# Patient Record
Sex: Female | Born: 1935 | Race: White | Hispanic: No | Smoking: Never smoker
Health system: Southern US, Community
[De-identification: ages and names within clinical notes are randomized; demographics above are authoritative.]

## PROBLEM LIST (undated history)

## (undated) DIAGNOSIS — I509 Heart failure, unspecified: Secondary | ICD-10-CM

## (undated) DIAGNOSIS — K219 Gastro-esophageal reflux disease without esophagitis: Secondary | ICD-10-CM

## (undated) DIAGNOSIS — F419 Anxiety disorder, unspecified: Secondary | ICD-10-CM

## (undated) DIAGNOSIS — I10 Essential (primary) hypertension: Secondary | ICD-10-CM

## (undated) DIAGNOSIS — H9190 Unspecified hearing loss, unspecified ear: Secondary | ICD-10-CM

## (undated) DIAGNOSIS — M1990 Unspecified osteoarthritis, unspecified site: Secondary | ICD-10-CM

## (undated) DIAGNOSIS — G459 Transient cerebral ischemic attack, unspecified: Secondary | ICD-10-CM

## (undated) DIAGNOSIS — J449 Chronic obstructive pulmonary disease, unspecified: Secondary | ICD-10-CM

## (undated) DIAGNOSIS — J8410 Pulmonary fibrosis, unspecified: Secondary | ICD-10-CM

## (undated) HISTORY — PX: CHOLECYSTECTOMY: SHX55

## (undated) HISTORY — PX: ABDOMINAL HYSTERECTOMY: SHX81

## (undated) HISTORY — PX: HERNIA REPAIR: SHX51

---

## 2004-05-28 ENCOUNTER — Ambulatory Visit: Payer: Self-pay | Admitting: Nurse Practitioner

## 2004-06-16 ENCOUNTER — Ambulatory Visit: Payer: Self-pay

## 2004-09-02 ENCOUNTER — Ambulatory Visit: Payer: Self-pay | Admitting: Podiatry

## 2005-05-19 ENCOUNTER — Ambulatory Visit: Payer: Self-pay | Admitting: Nurse Practitioner

## 2005-06-29 ENCOUNTER — Ambulatory Visit: Payer: Self-pay | Admitting: Gastroenterology

## 2006-05-25 ENCOUNTER — Ambulatory Visit: Payer: Self-pay | Admitting: Nurse Practitioner

## 2006-11-28 ENCOUNTER — Ambulatory Visit: Payer: Self-pay | Admitting: Podiatry

## 2006-12-02 ENCOUNTER — Ambulatory Visit: Payer: Self-pay | Admitting: Podiatry

## 2007-05-28 ENCOUNTER — Inpatient Hospital Stay: Payer: Self-pay | Admitting: Internal Medicine

## 2007-05-28 ENCOUNTER — Other Ambulatory Visit: Payer: Self-pay

## 2007-06-06 ENCOUNTER — Other Ambulatory Visit: Payer: Self-pay

## 2007-06-06 ENCOUNTER — Emergency Department: Payer: Self-pay | Admitting: Emergency Medicine

## 2007-07-22 ENCOUNTER — Emergency Department: Payer: Self-pay | Admitting: Internal Medicine

## 2007-08-02 ENCOUNTER — Ambulatory Visit: Payer: Self-pay | Admitting: Family Medicine

## 2007-11-13 ENCOUNTER — Other Ambulatory Visit: Payer: Self-pay

## 2007-11-13 ENCOUNTER — Emergency Department: Payer: Self-pay | Admitting: Emergency Medicine

## 2007-12-12 ENCOUNTER — Ambulatory Visit: Payer: Self-pay | Admitting: *Deleted

## 2008-04-08 ENCOUNTER — Emergency Department: Payer: Self-pay | Admitting: Internal Medicine

## 2008-05-02 ENCOUNTER — Inpatient Hospital Stay: Payer: Self-pay | Admitting: Internal Medicine

## 2008-11-25 ENCOUNTER — Emergency Department: Payer: Self-pay | Admitting: Unknown Physician Specialty

## 2008-12-16 ENCOUNTER — Ambulatory Visit: Payer: Self-pay | Admitting: Family Medicine

## 2009-04-22 ENCOUNTER — Emergency Department: Payer: Self-pay | Admitting: Emergency Medicine

## 2009-04-30 ENCOUNTER — Inpatient Hospital Stay: Payer: Self-pay | Admitting: Internal Medicine

## 2009-08-21 ENCOUNTER — Ambulatory Visit: Payer: Self-pay | Admitting: Specialist

## 2010-01-26 ENCOUNTER — Ambulatory Visit: Payer: Self-pay | Admitting: Family Medicine

## 2010-01-29 ENCOUNTER — Emergency Department: Payer: Self-pay | Admitting: Emergency Medicine

## 2010-03-21 ENCOUNTER — Inpatient Hospital Stay: Payer: Self-pay | Admitting: Internal Medicine

## 2010-07-31 ENCOUNTER — Emergency Department: Payer: Self-pay | Admitting: Emergency Medicine

## 2010-08-24 ENCOUNTER — Ambulatory Visit: Payer: Self-pay | Admitting: Pain Medicine

## 2010-08-24 ENCOUNTER — Ambulatory Visit: Payer: Self-pay | Admitting: Specialist

## 2010-09-02 ENCOUNTER — Ambulatory Visit: Payer: Self-pay | Admitting: Pain Medicine

## 2010-09-29 ENCOUNTER — Ambulatory Visit: Payer: Self-pay | Admitting: Pain Medicine

## 2010-10-19 ENCOUNTER — Ambulatory Visit: Payer: Self-pay | Admitting: Pain Medicine

## 2010-11-04 ENCOUNTER — Ambulatory Visit: Payer: Self-pay | Admitting: Pain Medicine

## 2010-11-13 ENCOUNTER — Inpatient Hospital Stay: Payer: Self-pay | Admitting: Internal Medicine

## 2010-11-26 ENCOUNTER — Inpatient Hospital Stay: Payer: Self-pay | Admitting: Internal Medicine

## 2010-12-22 ENCOUNTER — Inpatient Hospital Stay: Payer: Self-pay | Admitting: Specialist

## 2011-03-03 ENCOUNTER — Other Ambulatory Visit: Payer: Self-pay | Admitting: Unknown Physician Specialty

## 2011-03-13 ENCOUNTER — Emergency Department: Payer: Self-pay | Admitting: *Deleted

## 2011-03-13 ENCOUNTER — Other Ambulatory Visit: Payer: Self-pay

## 2011-04-28 ENCOUNTER — Ambulatory Visit: Payer: Self-pay | Admitting: Family Medicine

## 2012-01-09 ENCOUNTER — Observation Stay: Payer: Self-pay | Admitting: Internal Medicine

## 2012-01-09 LAB — COMPREHENSIVE METABOLIC PANEL
Anion Gap: 8 (ref 7–16)
BUN: 14 mg/dL (ref 7–18)
Bilirubin,Total: 0.3 mg/dL (ref 0.2–1.0)
Calcium, Total: 8.6 mg/dL (ref 8.5–10.1)
Chloride: 109 mmol/L — ABNORMAL HIGH (ref 98–107)
Co2: 25 mmol/L (ref 21–32)
EGFR (African American): >60
EGFR (Non-African Amer.): >60
Glucose: 115 mg/dL — ABNORMAL HIGH (ref 65–99)
Osmolality: 285 (ref 275–301)
Potassium: 4.7 mmol/L (ref 3.5–5.1)
SGOT(AST): 36 U/L (ref 15–37)
Sodium: 142 mmol/L (ref 136–145)
Total Protein: 7.9 g/dL (ref 6.4–8.2)

## 2012-01-09 LAB — TROPONIN I: Troponin-I: 0.06 ng/mL — ABNORMAL HIGH

## 2012-01-09 LAB — URINALYSIS, COMPLETE
Bacteria: NONE SEEN
Bilirubin,UR: NEGATIVE
Ketone: NEGATIVE
Leukocyte Esterase: NEGATIVE
Ph: 5 (ref 4.5–8.0)
Squamous Epithelial: 1

## 2012-01-09 LAB — CBC WITH DIFFERENTIAL/PLATELET
Basophil #: 0 10*3/uL (ref 0.0–0.1)
Basophil %: 1.1 %
Eosinophil #: 0.1 10*3/uL (ref 0.0–0.7)
HCT: 36.3 % (ref 35.0–47.0)
Lymphocyte %: 16.9 %
MCHC: 33.4 g/dL (ref 32.0–36.0)
MCV: 93 fL (ref 80–100)
Monocyte #: 0.5 x10 3/mm (ref 0.2–0.9)
Neutrophil #: 2.8 10*3/uL (ref 1.4–6.5)
Platelet: 150 10*3/uL (ref 150–440)
RDW: 16.2 % — ABNORMAL HIGH (ref 11.5–14.5)
WBC: 4.1 10*3/uL (ref 3.6–11.0)

## 2012-01-09 LAB — LIPASE, BLOOD: Lipase: 192 U/L (ref 73–393)

## 2012-01-10 LAB — CBC WITH DIFFERENTIAL/PLATELET
Basophil #: 0 10*3/uL (ref 0.0–0.1)
Basophil %: 1.1 %
Eosinophil #: 0.1 10*3/uL (ref 0.0–0.7)
HCT: 36 % (ref 35.0–47.0)
HGB: 12.4 g/dL (ref 12.0–16.0)
Lymphocyte #: 1.1 10*3/uL (ref 1.0–3.6)
MCHC: 34.3 g/dL (ref 32.0–36.0)
MCV: 93 fL (ref 80–100)
Monocyte %: 9 %
Neutrophil #: 2.1 10*3/uL (ref 1.4–6.5)
RDW: 15.6 % — ABNORMAL HIGH (ref 11.5–14.5)
WBC: 3.7 10*3/uL (ref 3.6–11.0)

## 2012-01-10 LAB — LIPID PANEL
HDL Cholesterol: 48 mg/dL (ref 40–60)
Ldl Cholesterol, Calc: 43 mg/dL (ref 0–100)
VLDL Cholesterol, Calc: 33 mg/dL (ref 5–40)

## 2012-01-10 LAB — BASIC METABOLIC PANEL
Calcium, Total: 8.7 mg/dL (ref 8.5–10.1)
Co2: 28 mmol/L (ref 21–32)
Creatinine: 0.84 mg/dL (ref 0.60–1.30)
EGFR (African American): >60
EGFR (Non-African Amer.): >60
Potassium: 3.7 mmol/L (ref 3.5–5.1)
Sodium: 143 mmol/L (ref 136–145)

## 2012-01-10 LAB — HEMOGLOBIN A1C: Hemoglobin A1C: 5.7 % (ref 4.2–6.3)

## 2012-01-10 LAB — CK-MB: CK-MB: 1.5 ng/mL (ref 0.5–3.6)

## 2012-01-10 LAB — CK: CK, Total: 59 U/L (ref 21–215)

## 2012-01-16 ENCOUNTER — Emergency Department: Payer: Self-pay | Admitting: Emergency Medicine

## 2012-01-16 LAB — COMPREHENSIVE METABOLIC PANEL
Albumin: 3.5 g/dL (ref 3.4–5.0)
Alkaline Phosphatase: 71 U/L (ref 50–136)
Anion Gap: 9 (ref 7–16)
Bilirubin,Total: 0.4 mg/dL (ref 0.2–1.0)
Co2: 26 mmol/L (ref 21–32)
Creatinine: 0.81 mg/dL (ref 0.60–1.30)
EGFR (Non-African Amer.): >60
Glucose: 112 mg/dL — ABNORMAL HIGH (ref 65–99)
Osmolality: 279 (ref 275–301)
Potassium: 3.9 mmol/L (ref 3.5–5.1)
SGPT (ALT): 26 U/L (ref 12–78)
Sodium: 139 mmol/L (ref 136–145)
Total Protein: 8.5 g/dL — ABNORMAL HIGH (ref 6.4–8.2)

## 2012-01-16 LAB — LIPASE, BLOOD: Lipase: 201 U/L (ref 73–393)

## 2012-01-16 LAB — CBC
HGB: 12.9 g/dL (ref 12.0–16.0)
MCH: 31.3 pg (ref 26.0–34.0)
Platelet: 168 10*3/uL (ref 150–440)
RBC: 4.12 10*6/uL (ref 3.80–5.20)
WBC: 5.7 10*3/uL (ref 3.6–11.0)

## 2012-01-16 LAB — URINALYSIS, COMPLETE
Bilirubin,UR: NEGATIVE
Glucose,UR: NEGATIVE mg/dL (ref 0–75)
Ketone: NEGATIVE
Leukocyte Esterase: NEGATIVE
Protein: NEGATIVE
RBC,UR: 1 /HPF (ref 0–5)
Squamous Epithelial: 1
WBC UR: <1 /HPF (ref 0–5)

## 2012-01-21 ENCOUNTER — Emergency Department: Payer: Self-pay | Admitting: Emergency Medicine

## 2012-01-21 LAB — URINALYSIS, COMPLETE
Bilirubin,UR: NEGATIVE
Leukocyte Esterase: NEGATIVE
Ph: 7 (ref 4.5–8.0)
Protein: NEGATIVE
RBC,UR: NONE SEEN /HPF (ref 0–5)

## 2012-02-16 ENCOUNTER — Ambulatory Visit: Payer: Self-pay | Admitting: Family Medicine

## 2012-09-23 ENCOUNTER — Emergency Department: Payer: Self-pay | Admitting: Emergency Medicine

## 2012-09-23 LAB — CBC
HGB: 10.8 g/dL — ABNORMAL LOW (ref 12.0–16.0)
MCHC: 33.8 g/dL (ref 32.0–36.0)
MCV: 87 fL (ref 80–100)
RBC: 3.68 10*6/uL — ABNORMAL LOW (ref 3.80–5.20)
WBC: 4.8 10*3/uL (ref 3.6–11.0)

## 2012-09-23 LAB — BASIC METABOLIC PANEL
Anion Gap: 8 (ref 7–16)
BUN: 14 mg/dL (ref 7–18)
Calcium, Total: 9.2 mg/dL (ref 8.5–10.1)
Chloride: 104 mmol/L (ref 98–107)
Creatinine: 1.34 mg/dL — ABNORMAL HIGH (ref 0.60–1.30)
EGFR (Non-African Amer.): 37 — ABNORMAL LOW
Osmolality: 278 (ref 275–301)

## 2012-09-23 LAB — HEPATIC FUNCTION PANEL A (ARMC)
Albumin: 3.2 g/dL — ABNORMAL LOW (ref 3.4–5.0)
Alkaline Phosphatase: 65 U/L (ref 50–136)
Bilirubin,Total: 0.3 mg/dL (ref 0.2–1.0)
SGOT(AST): 22 U/L (ref 15–37)
SGPT (ALT): 20 U/L (ref 12–78)
Total Protein: 8 g/dL (ref 6.4–8.2)

## 2012-09-23 LAB — PRO B NATRIURETIC PEPTIDE: B-Type Natriuretic Peptide: 597 pg/mL — ABNORMAL HIGH (ref 0–450)

## 2012-09-23 LAB — TROPONIN I: Troponin-I: <0.02 ng/mL

## 2012-09-24 LAB — TROPONIN I: Troponin-I: <0.02 ng/mL

## 2012-09-24 LAB — CK TOTAL AND CKMB (NOT AT ARMC): CK, Total: 63 U/L (ref 21–215)

## 2013-03-31 ENCOUNTER — Inpatient Hospital Stay: Payer: Self-pay | Admitting: Student

## 2013-03-31 LAB — CBC
HCT: 33.4 % — ABNORMAL LOW (ref 35.0–47.0)
HGB: 11.5 g/dL — AB (ref 12.0–16.0)
MCH: 31.7 pg (ref 26.0–34.0)
MCHC: 34.3 g/dL (ref 32.0–36.0)
MCV: 92 fL (ref 80–100)
Platelet: 173 10*3/uL (ref 150–440)
RBC: 3.62 10*6/uL — AB (ref 3.80–5.20)
RDW: 14.8 % — ABNORMAL HIGH (ref 11.5–14.5)
WBC: 14.3 10*3/uL — AB (ref 3.6–11.0)

## 2013-03-31 LAB — COMPREHENSIVE METABOLIC PANEL
ALBUMIN: 3.4 g/dL (ref 3.4–5.0)
Alkaline Phosphatase: 62 U/L
Anion Gap: 4 — ABNORMAL LOW (ref 7–16)
BUN: 30 mg/dL — ABNORMAL HIGH (ref 7–18)
Bilirubin,Total: 0.7 mg/dL (ref 0.2–1.0)
CREATININE: 1.68 mg/dL — AB (ref 0.60–1.30)
Calcium, Total: 8.6 mg/dL (ref 8.5–10.1)
Chloride: 98 mmol/L (ref 98–107)
Co2: 26 mmol/L (ref 21–32)
EGFR (African American): 32 — ABNORMAL LOW
EGFR (Non-African Amer.): 28 — ABNORMAL LOW
Glucose: 115 mg/dL — ABNORMAL HIGH (ref 65–99)
OSMOLALITY: 264 (ref 275–301)
Potassium: 3.8 mmol/L (ref 3.5–5.1)
SGOT(AST): 38 U/L — ABNORMAL HIGH (ref 15–37)
SGPT (ALT): 26 U/L (ref 12–78)
SODIUM: 128 mmol/L — AB (ref 136–145)
TOTAL PROTEIN: 8.6 g/dL — AB (ref 6.4–8.2)

## 2013-03-31 LAB — URINALYSIS, COMPLETE
Bilirubin,UR: NEGATIVE
Blood: NEGATIVE
Glucose,UR: NEGATIVE mg/dL (ref 0–75)
Granular Cast: 1
KETONE: NEGATIVE
Nitrite: NEGATIVE
PH: 5 (ref 4.5–8.0)
RBC,UR: 2 /HPF (ref 0–5)
SPECIFIC GRAVITY: 1.017 (ref 1.003–1.030)
Transitional Epi: 2

## 2013-03-31 LAB — CK TOTAL AND CKMB (NOT AT ARMC)
CK, Total: 292 U/L — ABNORMAL HIGH (ref 21–215)
CK-MB: 3.1 ng/mL (ref 0.5–3.6)

## 2013-03-31 LAB — TROPONIN I: Troponin-I: <0.02 ng/mL

## 2013-04-01 LAB — BASIC METABOLIC PANEL
Anion Gap: 6 — ABNORMAL LOW (ref 7–16)
BUN: 22 mg/dL — ABNORMAL HIGH (ref 7–18)
Calcium, Total: 8.2 mg/dL — ABNORMAL LOW (ref 8.5–10.1)
Chloride: 104 mmol/L (ref 98–107)
Co2: 26 mmol/L (ref 21–32)
Creatinine: 1.15 mg/dL (ref 0.60–1.30)
EGFR (African American): 51 — ABNORMAL LOW
GFR CALC NON AF AMER: 44 — AB
Glucose: 96 mg/dL (ref 65–99)
Osmolality: 275 (ref 275–301)
POTASSIUM: 3.7 mmol/L (ref 3.5–5.1)
SODIUM: 136 mmol/L (ref 136–145)

## 2013-04-01 LAB — CBC WITH DIFFERENTIAL/PLATELET
Basophil #: 0 10*3/uL (ref 0.0–0.1)
Basophil %: 0.2 %
EOS ABS: 0.1 10*3/uL (ref 0.0–0.7)
Eosinophil %: 0.6 %
HCT: 30 % — AB (ref 35.0–47.0)
HGB: 10 g/dL — ABNORMAL LOW (ref 12.0–16.0)
LYMPHS PCT: 14.6 %
Lymphocyte #: 1.7 10*3/uL (ref 1.0–3.6)
MCH: 30.9 pg (ref 26.0–34.0)
MCHC: 33.2 g/dL (ref 32.0–36.0)
MCV: 93 fL (ref 80–100)
MONO ABS: 1.1 x10 3/mm — AB (ref 0.2–0.9)
Monocyte %: 9.1 %
NEUTROS ABS: 8.8 10*3/uL — AB (ref 1.4–6.5)
Neutrophil %: 75.5 %
Platelet: 154 10*3/uL (ref 150–440)
RBC: 3.22 10*6/uL — AB (ref 3.80–5.20)
RDW: 14.6 % — ABNORMAL HIGH (ref 11.5–14.5)
WBC: 11.6 10*3/uL — ABNORMAL HIGH (ref 3.6–11.0)

## 2013-04-01 LAB — MAGNESIUM: Magnesium: 1.7 mg/dL — ABNORMAL LOW

## 2013-04-01 LAB — TSH: Thyroid Stimulating Horm: 0.656 u[IU]/mL

## 2013-04-02 LAB — CBC WITH DIFFERENTIAL/PLATELET
Basophil #: 0 10*3/uL (ref 0.0–0.1)
Basophil %: 0.1 %
Eosinophil #: 0 10*3/uL (ref 0.0–0.7)
Eosinophil %: 0 %
HCT: 27.2 % — ABNORMAL LOW (ref 35.0–47.0)
HGB: 9.4 g/dL — ABNORMAL LOW (ref 12.0–16.0)
Lymphocyte #: 0.5 10*3/uL — ABNORMAL LOW (ref 1.0–3.6)
Lymphocyte %: 7.8 %
MCH: 32.6 pg (ref 26.0–34.0)
MCHC: 34.5 g/dL (ref 32.0–36.0)
MCV: 94 fL (ref 80–100)
MONO ABS: 0.1 x10 3/mm — AB (ref 0.2–0.9)
MONOS PCT: 2 %
Neutrophil #: 5.5 10*3/uL (ref 1.4–6.5)
Neutrophil %: 90.1 %
Platelet: 146 10*3/uL — ABNORMAL LOW (ref 150–440)
RBC: 2.88 10*6/uL — AB (ref 3.80–5.20)
RDW: 14.7 % — AB (ref 11.5–14.5)
WBC: 6.1 10*3/uL (ref 3.6–11.0)

## 2013-04-03 LAB — CBC WITH DIFFERENTIAL/PLATELET
Basophil #: 0 10*3/uL (ref 0.0–0.1)
Basophil %: 0.3 %
Eosinophil #: 0 10*3/uL (ref 0.0–0.7)
Eosinophil %: 0 %
HCT: 30.9 % — AB (ref 35.0–47.0)
HGB: 10.5 g/dL — ABNORMAL LOW (ref 12.0–16.0)
LYMPHS ABS: 0.7 10*3/uL — AB (ref 1.0–3.6)
Lymphocyte %: 5.5 %
MCH: 32.6 pg (ref 26.0–34.0)
MCHC: 33.9 g/dL (ref 32.0–36.0)
MCV: 96 fL (ref 80–100)
MONOS PCT: 2.4 %
Monocyte #: 0.3 x10 3/mm (ref 0.2–0.9)
NEUTROS ABS: 11.1 10*3/uL — AB (ref 1.4–6.5)
Neutrophil %: 91.8 %
Platelet: 181 10*3/uL (ref 150–440)
RBC: 3.21 10*6/uL — ABNORMAL LOW (ref 3.80–5.20)
RDW: 14.8 % — ABNORMAL HIGH (ref 11.5–14.5)
WBC: 12.1 10*3/uL — ABNORMAL HIGH (ref 3.6–11.0)

## 2013-04-05 LAB — CULTURE, BLOOD (SINGLE)

## 2013-09-30 ENCOUNTER — Emergency Department: Payer: Self-pay | Admitting: Emergency Medicine

## 2013-09-30 LAB — BASIC METABOLIC PANEL
Anion Gap: 5 — ABNORMAL LOW (ref 7–16)
BUN: 21 mg/dL — ABNORMAL HIGH (ref 7–18)
Calcium, Total: 8.8 mg/dL (ref 8.5–10.1)
Chloride: 106 mmol/L (ref 98–107)
Co2: 28 mmol/L (ref 21–32)
Creatinine: 1.1 mg/dL (ref 0.60–1.30)
EGFR (Non-African Amer.): 46 — ABNORMAL LOW
GFR CALC AF AMER: 53 — AB
Glucose: 117 mg/dL — ABNORMAL HIGH (ref 65–99)
Osmolality: 282 (ref 275–301)
POTASSIUM: 4.6 mmol/L (ref 3.5–5.1)
SODIUM: 139 mmol/L (ref 136–145)

## 2013-09-30 LAB — CBC
HCT: 36 % (ref 35.0–47.0)
HGB: 11.7 g/dL — ABNORMAL LOW (ref 12.0–16.0)
MCH: 30.4 pg (ref 26.0–34.0)
MCHC: 32.4 g/dL (ref 32.0–36.0)
MCV: 94 fL (ref 80–100)
PLATELETS: 150 10*3/uL (ref 150–440)
RBC: 3.84 10*6/uL (ref 3.80–5.20)
RDW: 16.2 % — ABNORMAL HIGH (ref 11.5–14.5)
WBC: 6.6 10*3/uL (ref 3.6–11.0)

## 2013-09-30 LAB — PRO B NATRIURETIC PEPTIDE: B-Type Natriuretic Peptide: 278 pg/mL (ref 0–450)

## 2013-09-30 LAB — TROPONIN I: Troponin-I: <0.02 ng/mL

## 2013-10-29 ENCOUNTER — Observation Stay: Payer: Self-pay | Admitting: Internal Medicine

## 2013-10-29 LAB — CBC
HCT: 34.2 % — ABNORMAL LOW (ref 35.0–47.0)
HGB: 11.1 g/dL — ABNORMAL LOW (ref 12.0–16.0)
MCH: 30.5 pg (ref 26.0–34.0)
MCHC: 32.4 g/dL (ref 32.0–36.0)
MCV: 94 fL (ref 80–100)
PLATELETS: 168 10*3/uL (ref 150–440)
RBC: 3.63 10*6/uL — AB (ref 3.80–5.20)
RDW: 15.8 % — AB (ref 11.5–14.5)
WBC: 8.6 10*3/uL (ref 3.6–11.0)

## 2013-10-29 LAB — BASIC METABOLIC PANEL
ANION GAP: 6 — AB (ref 7–16)
BUN: 21 mg/dL — ABNORMAL HIGH (ref 7–18)
CHLORIDE: 102 mmol/L (ref 98–107)
Calcium, Total: 8.1 mg/dL — ABNORMAL LOW (ref 8.5–10.1)
Co2: 28 mmol/L (ref 21–32)
Creatinine: 1.09 mg/dL (ref 0.60–1.30)
EGFR (Non-African Amer.): 47 — ABNORMAL LOW
GFR CALC AF AMER: 54 — AB
Glucose: 118 mg/dL — ABNORMAL HIGH (ref 65–99)
OSMOLALITY: 276 (ref 275–301)
Potassium: 4.5 mmol/L (ref 3.5–5.1)
Sodium: 136 mmol/L (ref 136–145)

## 2013-10-29 LAB — PROTIME-INR
INR: 1.2
PROTHROMBIN TIME: 14.6 s (ref 11.5–14.7)

## 2013-10-29 LAB — TROPONIN I
Troponin-I: <0.02 ng/mL
Troponin-I: <0.02 ng/mL

## 2013-10-29 LAB — APTT: Activated PTT: 29.4 secs (ref 23.6–35.9)

## 2013-10-29 LAB — PRO B NATRIURETIC PEPTIDE: B-Type Natriuretic Peptide: 159 pg/mL (ref 0–450)

## 2013-10-30 LAB — CBC WITH DIFFERENTIAL/PLATELET
BASOS ABS: 0 10*3/uL (ref 0.0–0.1)
Basophil %: 0.2 %
EOS PCT: 0 %
Eosinophil #: 0 10*3/uL (ref 0.0–0.7)
HCT: 31.2 % — AB (ref 35.0–47.0)
HGB: 10.4 g/dL — ABNORMAL LOW (ref 12.0–16.0)
LYMPHS ABS: 0.6 10*3/uL — AB (ref 1.0–3.6)
LYMPHS PCT: 8.8 %
MCH: 31.1 pg (ref 26.0–34.0)
MCHC: 33.3 g/dL (ref 32.0–36.0)
MCV: 94 fL (ref 80–100)
Monocyte #: 0.1 x10 3/mm — ABNORMAL LOW (ref 0.2–0.9)
Monocyte %: 2.1 %
Neutrophil #: 5.7 10*3/uL (ref 1.4–6.5)
Neutrophil %: 88.9 %
PLATELETS: 148 10*3/uL — AB (ref 150–440)
RBC: 3.34 10*6/uL — ABNORMAL LOW (ref 3.80–5.20)
RDW: 15.8 % — AB (ref 11.5–14.5)
WBC: 6.4 10*3/uL (ref 3.6–11.0)

## 2013-10-30 LAB — BASIC METABOLIC PANEL
ANION GAP: 10 (ref 7–16)
BUN: 26 mg/dL — ABNORMAL HIGH (ref 7–18)
CALCIUM: 7.7 mg/dL — AB (ref 8.5–10.1)
CHLORIDE: 105 mmol/L (ref 98–107)
CO2: 26 mmol/L (ref 21–32)
CREATININE: 1.05 mg/dL (ref 0.60–1.30)
GFR CALC AF AMER: 56 — AB
GFR CALC NON AF AMER: 49 — AB
Glucose: 125 mg/dL — ABNORMAL HIGH (ref 65–99)
Osmolality: 287 (ref 275–301)
Potassium: 4.7 mmol/L (ref 3.5–5.1)
SODIUM: 141 mmol/L (ref 136–145)

## 2014-01-30 ENCOUNTER — Ambulatory Visit: Payer: Self-pay | Admitting: Family Medicine

## 2014-02-25 ENCOUNTER — Emergency Department: Payer: Self-pay | Admitting: Emergency Medicine

## 2014-02-25 LAB — BASIC METABOLIC PANEL
ANION GAP: 6 — AB (ref 7–16)
BUN: 23 mg/dL — AB (ref 7–18)
CHLORIDE: 102 mmol/L (ref 98–107)
CO2: 27 mmol/L (ref 21–32)
CREATININE: 0.85 mg/dL (ref 0.60–1.30)
Calcium, Total: 8.3 mg/dL — ABNORMAL LOW (ref 8.5–10.1)
EGFR (African American): >60
GLUCOSE: 118 mg/dL — AB (ref 65–99)
OSMOLALITY: 275 (ref 275–301)
Potassium: 4.4 mmol/L (ref 3.5–5.1)
Sodium: 135 mmol/L — ABNORMAL LOW (ref 136–145)

## 2014-02-25 LAB — CBC
HCT: 35.8 % (ref 35.0–47.0)
HGB: 11.5 g/dL — AB (ref 12.0–16.0)
MCH: 30.8 pg (ref 26.0–34.0)
MCHC: 32.1 g/dL (ref 32.0–36.0)
MCV: 96 fL (ref 80–100)
Platelet: 125 10*3/uL — ABNORMAL LOW (ref 150–440)
RBC: 3.74 10*6/uL — AB (ref 3.80–5.20)
RDW: 16.9 % — ABNORMAL HIGH (ref 11.5–14.5)
WBC: 6.8 10*3/uL (ref 3.6–11.0)

## 2014-02-25 LAB — PRO B NATRIURETIC PEPTIDE: B-Type Natriuretic Peptide: 357 pg/mL (ref 0–450)

## 2014-02-25 LAB — TROPONIN I: Troponin-I: 0.02 ng/mL

## 2014-02-26 LAB — TROPONIN I: TROPONIN-I: 0.03 ng/mL

## 2014-04-01 ENCOUNTER — Encounter: Payer: Self-pay | Admitting: Surgery

## 2014-04-01 ENCOUNTER — Ambulatory Visit: Payer: Self-pay | Admitting: Surgery

## 2014-04-05 LAB — WOUND AEROBIC CULTURE

## 2014-04-12 ENCOUNTER — Inpatient Hospital Stay: Payer: Self-pay | Admitting: Internal Medicine

## 2014-04-12 LAB — COMPREHENSIVE METABOLIC PANEL
ALBUMIN: 3.2 g/dL — AB (ref 3.4–5.0)
ALK PHOS: 63 U/L (ref 46–116)
ANION GAP: 8 (ref 7–16)
AST: 26 U/L (ref 15–37)
BUN: 12 mg/dL (ref 7–18)
Bilirubin,Total: 0.3 mg/dL (ref 0.2–1.0)
CALCIUM: 8.4 mg/dL — AB (ref 8.5–10.1)
Chloride: 102 mmol/L (ref 98–107)
Co2: 27 mmol/L (ref 21–32)
Creatinine: 0.98 mg/dL (ref 0.60–1.30)
EGFR (African American): >60
EGFR (Non-African Amer.): 57 — ABNORMAL LOW
GLUCOSE: 75 mg/dL (ref 65–99)
Osmolality: 272 (ref 275–301)
Potassium: 3.8 mmol/L (ref 3.5–5.1)
SGPT (ALT): 23 U/L (ref 14–63)
Sodium: 137 mmol/L (ref 136–145)
Total Protein: 7.4 g/dL (ref 6.4–8.2)

## 2014-04-12 LAB — CBC
HCT: 37.9 % (ref 35.0–47.0)
HGB: 12.3 g/dL (ref 12.0–16.0)
MCH: 30.6 pg (ref 26.0–34.0)
MCHC: 32.5 g/dL (ref 32.0–36.0)
MCV: 94 fL (ref 80–100)
Platelet: 152 10*3/uL (ref 150–440)
RBC: 4.02 10*6/uL (ref 3.80–5.20)
RDW: 17.2 % — ABNORMAL HIGH (ref 11.5–14.5)
WBC: 6.9 10*3/uL (ref 3.6–11.0)

## 2014-04-12 LAB — PROTIME-INR
INR: 1.1
Prothrombin Time: 14.1 secs (ref 11.5–14.7)

## 2014-04-12 LAB — CK TOTAL AND CKMB (NOT AT ARMC)
CK, TOTAL: 95 U/L (ref 26–192)
CK-MB: 2.4 ng/mL (ref 0.5–3.6)

## 2014-04-12 LAB — TROPONIN I: Troponin-I: 0.13 ng/mL — ABNORMAL HIGH

## 2014-04-12 LAB — PRO B NATRIURETIC PEPTIDE: B-TYPE NATIURETIC PEPTID: 402 pg/mL (ref 0–450)

## 2014-04-12 LAB — HEPARIN LEVEL (UNFRACTIONATED): Anti-Xa(Unfractionated): 0.36 IU/mL (ref 0.30–0.70)

## 2014-04-12 LAB — APTT: ACTIVATED PTT: 24.9 s (ref 23.6–35.9)

## 2014-04-13 LAB — BASIC METABOLIC PANEL
Anion Gap: 4 — ABNORMAL LOW (ref 7–16)
BUN: 15 mg/dL (ref 7–18)
CALCIUM: 8.1 mg/dL — AB (ref 8.5–10.1)
CREATININE: 0.94 mg/dL (ref 0.60–1.30)
Chloride: 104 mmol/L (ref 98–107)
Co2: 30 mmol/L (ref 21–32)
EGFR (Non-African Amer.): >60
GLUCOSE: 137 mg/dL — AB (ref 65–99)
Osmolality: 279 (ref 275–301)
Potassium: 4.5 mmol/L (ref 3.5–5.1)
SODIUM: 138 mmol/L (ref 136–145)

## 2014-04-13 LAB — CBC WITH DIFFERENTIAL/PLATELET
Basophil #: 0 10*3/uL (ref 0.0–0.1)
Basophil %: 0.3 %
Eosinophil #: 0 10*3/uL (ref 0.0–0.7)
Eosinophil %: 0 %
HCT: 33.8 % — ABNORMAL LOW (ref 35.0–47.0)
HGB: 10.9 g/dL — ABNORMAL LOW (ref 12.0–16.0)
LYMPHS PCT: 2.1 %
Lymphocyte #: 0.2 10*3/uL — ABNORMAL LOW (ref 1.0–3.6)
MCH: 30.5 pg (ref 26.0–34.0)
MCHC: 32.3 g/dL (ref 32.0–36.0)
MCV: 94 fL (ref 80–100)
MONOS PCT: 3 %
Monocyte #: 0.2 x10 3/mm (ref 0.2–0.9)
Neutrophil #: 7.9 10*3/uL — ABNORMAL HIGH (ref 1.4–6.5)
Neutrophil %: 94.6 %
Platelet: 135 10*3/uL — ABNORMAL LOW (ref 150–440)
RBC: 3.58 10*6/uL — AB (ref 3.80–5.20)
RDW: 16.8 % — ABNORMAL HIGH (ref 11.5–14.5)
WBC: 8.4 10*3/uL (ref 3.6–11.0)

## 2014-04-13 LAB — HEPARIN LEVEL (UNFRACTIONATED): ANTI-XA(UNFRACTIONATED): 0.34 [IU]/mL (ref 0.30–0.70)

## 2014-04-13 LAB — CK-MB
CK-MB: 4 ng/mL — ABNORMAL HIGH (ref 0.5–3.6)
CK-MB: 3.4 ng/mL (ref 0.5–3.6)

## 2014-04-13 LAB — MAGNESIUM: Magnesium: 2 mg/dL

## 2014-04-13 LAB — TROPONIN I
TROPONIN-I: 0.11 ng/mL — AB
Troponin-I: 0.09 ng/mL — ABNORMAL HIGH

## 2014-04-14 LAB — CBC WITH DIFFERENTIAL/PLATELET
BASOS PCT: 0.2 %
Basophil #: 0 10*3/uL (ref 0.0–0.1)
EOS ABS: 0 10*3/uL (ref 0.0–0.7)
Eosinophil %: 0 %
HCT: 36.5 % (ref 35.0–47.0)
HGB: 11.9 g/dL — ABNORMAL LOW (ref 12.0–16.0)
Lymphocyte #: 0.2 10*3/uL — ABNORMAL LOW (ref 1.0–3.6)
Lymphocyte %: 1.7 %
MCH: 30.4 pg (ref 26.0–34.0)
MCHC: 32.5 g/dL (ref 32.0–36.0)
MCV: 94 fL (ref 80–100)
MONO ABS: 0.3 x10 3/mm (ref 0.2–0.9)
Monocyte %: 3.7 %
NEUTROS ABS: 8.6 10*3/uL — AB (ref 1.4–6.5)
Neutrophil %: 94.4 %
Platelet: 141 10*3/uL — ABNORMAL LOW (ref 150–440)
RBC: 3.9 10*6/uL (ref 3.80–5.20)
RDW: 17 % — ABNORMAL HIGH (ref 11.5–14.5)
WBC: 9.1 10*3/uL (ref 3.6–11.0)

## 2014-04-14 LAB — BASIC METABOLIC PANEL
ANION GAP: 2 — AB (ref 7–16)
BUN: 15 mg/dL (ref 7–18)
CHLORIDE: 103 mmol/L (ref 98–107)
CREATININE: 0.99 mg/dL (ref 0.60–1.30)
Calcium, Total: 8.6 mg/dL (ref 8.5–10.1)
Co2: 33 mmol/L — ABNORMAL HIGH (ref 21–32)
GFR CALC NON AF AMER: 57 — AB
GLUCOSE: 126 mg/dL — AB (ref 65–99)
Osmolality: 278 (ref 275–301)
Potassium: 4.4 mmol/L (ref 3.5–5.1)
Sodium: 138 mmol/L (ref 136–145)

## 2014-04-15 LAB — BASIC METABOLIC PANEL
Anion Gap: 5 — ABNORMAL LOW (ref 7–16)
BUN: 17 mg/dL (ref 7–18)
CO2: 33 mmol/L — AB (ref 21–32)
CREATININE: 0.77 mg/dL (ref 0.60–1.30)
Calcium, Total: 8.7 mg/dL (ref 8.5–10.1)
Chloride: 101 mmol/L (ref 98–107)
EGFR (Non-African Amer.): >60
GLUCOSE: 79 mg/dL (ref 65–99)
Osmolality: 278 (ref 275–301)
Potassium: 4.2 mmol/L (ref 3.5–5.1)
Sodium: 139 mmol/L (ref 136–145)

## 2014-04-15 LAB — CBC WITH DIFFERENTIAL/PLATELET
BASOS PCT: 0.2 %
Basophil #: 0 10*3/uL (ref 0.0–0.1)
EOS ABS: 0 10*3/uL (ref 0.0–0.7)
Eosinophil %: 0 %
HCT: 34.7 % — AB (ref 35.0–47.0)
HGB: 11.3 g/dL — AB (ref 12.0–16.0)
LYMPHS ABS: 0.5 10*3/uL — AB (ref 1.0–3.6)
LYMPHS PCT: 7.5 %
MCH: 30.2 pg (ref 26.0–34.0)
MCHC: 32.6 g/dL (ref 32.0–36.0)
MCV: 93 fL (ref 80–100)
MONO ABS: 0.6 x10 3/mm (ref 0.2–0.9)
Monocyte %: 8.9 %
NEUTROS ABS: 5.4 10*3/uL (ref 1.4–6.5)
Neutrophil %: 83.4 %
Platelet: 126 10*3/uL — ABNORMAL LOW (ref 150–440)
RBC: 3.74 10*6/uL — ABNORMAL LOW (ref 3.80–5.20)
RDW: 17 % — AB (ref 11.5–14.5)
WBC: 6.5 10*3/uL (ref 3.6–11.0)

## 2014-05-29 ENCOUNTER — Observation Stay: Payer: Self-pay | Admitting: Internal Medicine

## 2014-06-14 ENCOUNTER — Ambulatory Visit
Admit: 2014-06-14 | Disposition: A | Discharge status: Home / Self Care | Payer: Self-pay | Attending: Internal Medicine | Admitting: Internal Medicine

## 2014-06-20 ENCOUNTER — Inpatient Hospital Stay
Admit: 2014-06-20 | Disposition: A | Discharge status: Skilled Nursing Facility | Payer: Self-pay | Attending: Internal Medicine | Admitting: Internal Medicine

## 2014-06-20 LAB — PRO B NATRIURETIC PEPTIDE: B-Type Natriuretic Peptide: 862 pg/mL — ABNORMAL HIGH

## 2014-06-20 LAB — COMPREHENSIVE METABOLIC PANEL
ALBUMIN: 3.2 g/dL — AB
ALT: 16 U/L
AST: 38 U/L
Alkaline Phosphatase: 39 U/L
Anion Gap: 11 (ref 7–16)
BUN: 30 mg/dL — ABNORMAL HIGH
Bilirubin,Total: 0.6 mg/dL
Calcium, Total: 8.8 mg/dL — ABNORMAL LOW
Chloride: 103 mmol/L
Co2: 26 mmol/L
Creatinine: 2.06 mg/dL — ABNORMAL HIGH
EGFR (African American): 25 — ABNORMAL LOW
EGFR (Non-African Amer.): 21 — ABNORMAL LOW
GLUCOSE: 111 mg/dL — AB
Potassium: 4 mmol/L
Sodium: 140 mmol/L
Total Protein: 6.5 g/dL

## 2014-06-20 LAB — URINALYSIS, COMPLETE
Bilirubin,UR: NEGATIVE
Blood: NEGATIVE
Glucose,UR: 50 mg/dL (ref 0–75)
KETONE: NEGATIVE
Leukocyte Esterase: NEGATIVE
Nitrite: NEGATIVE
Ph: 6 (ref 4.5–8.0)
Protein: NEGATIVE
Specific Gravity: 1.015 (ref 1.003–1.030)

## 2014-06-20 LAB — CBC
HCT: 35.8 % (ref 35.0–47.0)
HGB: 11.1 g/dL — ABNORMAL LOW (ref 12.0–16.0)
MCH: 29.8 pg (ref 26.0–34.0)
MCHC: 31.1 g/dL — ABNORMAL LOW (ref 32.0–36.0)
MCV: 96 fL (ref 80–100)
Platelet: 160 10*3/uL (ref 150–440)
RBC: 3.74 10*6/uL — AB (ref 3.80–5.20)
RDW: 16.2 % — ABNORMAL HIGH (ref 11.5–14.5)
WBC: 20.8 10*3/uL — AB (ref 3.6–11.0)

## 2014-06-20 LAB — TROPONIN I
TROPONIN-I: 0.39 ng/mL — AB
Troponin-I: 0.62 ng/mL — ABNORMAL HIGH
Troponin-I: 0.24 ng/mL — ABNORMAL HIGH

## 2014-06-20 LAB — CK-MB
CK-MB: 11 ng/mL — AB
CK-MB: 12.2 ng/mL — AB
CK-MB: 11.7 ng/mL — ABNORMAL HIGH

## 2014-06-20 LAB — LACTIC ACID, PLASMA: LACTIC ACID, VENOUS: 1.3 mmol/L

## 2014-06-21 LAB — CBC WITH DIFFERENTIAL/PLATELET
Basophil #: 0 10*3/uL (ref 0.0–0.1)
Basophil %: 0.1 %
Eosinophil #: 0 10*3/uL (ref 0.0–0.7)
Eosinophil %: 0 %
HCT: 33.6 % — ABNORMAL LOW (ref 35.0–47.0)
HGB: 10.5 g/dL — ABNORMAL LOW (ref 12.0–16.0)
Lymphocyte #: 0.5 10*3/uL — ABNORMAL LOW (ref 1.0–3.6)
Lymphocyte %: 2.1 %
MCH: 29.8 pg (ref 26.0–34.0)
MCHC: 31.4 g/dL — ABNORMAL LOW (ref 32.0–36.0)
MCV: 95 fL (ref 80–100)
Monocyte #: 0.7 x10 3/mm (ref 0.2–0.9)
Monocyte %: 3.4 %
Neutrophil #: 20.1 10*3/uL — ABNORMAL HIGH (ref 1.4–6.5)
Neutrophil %: 94.4 %
Platelet: 174 10*3/uL (ref 150–440)
RBC: 3.53 10*6/uL — ABNORMAL LOW (ref 3.80–5.20)
RDW: 16 % — ABNORMAL HIGH (ref 11.5–14.5)
WBC: 21.3 10*3/uL — ABNORMAL HIGH (ref 3.6–11.0)

## 2014-06-21 LAB — COMPREHENSIVE METABOLIC PANEL
ALBUMIN: 2.9 g/dL — AB
ALK PHOS: 35 U/L — AB
ANION GAP: 5 — AB (ref 7–16)
BILIRUBIN TOTAL: 0.7 mg/dL
BUN: 24 mg/dL — ABNORMAL HIGH
CALCIUM: 7.8 mg/dL — AB
CO2: 28 mmol/L
Chloride: 109 mmol/L
Creatinine: 1.18 mg/dL — ABNORMAL HIGH
EGFR (African American): 49 — ABNORMAL LOW
EGFR (Non-African Amer.): 42 — ABNORMAL LOW
GLUCOSE: 159 mg/dL — AB
POTASSIUM: 4.5 mmol/L
SGOT(AST): 41 U/L
SGPT (ALT): 22 U/L
SODIUM: 142 mmol/L
TOTAL PROTEIN: 6.3 g/dL — AB

## 2014-06-21 LAB — MAGNESIUM: MAGNESIUM: 1.9 mg/dL

## 2014-06-21 LAB — TSH: THYROID STIMULATING HORM: 0.218 u[IU]/mL — AB

## 2014-06-21 LAB — PROTIME-INR
INR: 1.4
Prothrombin Time: 17.1 secs — ABNORMAL HIGH

## 2014-06-22 LAB — CBC WITH DIFFERENTIAL/PLATELET
BASOS PCT: 0.3 %
Basophil #: 0 10*3/uL (ref 0.0–0.1)
EOS PCT: 0.3 %
Eosinophil #: 0 10*3/uL (ref 0.0–0.7)
HCT: 30.6 % — AB (ref 35.0–47.0)
HGB: 9.9 g/dL — AB (ref 12.0–16.0)
LYMPHS PCT: 7 %
Lymphocyte #: 0.8 10*3/uL — ABNORMAL LOW (ref 1.0–3.6)
MCH: 30.6 pg (ref 26.0–34.0)
MCHC: 32.4 g/dL (ref 32.0–36.0)
MCV: 94 fL (ref 80–100)
MONOS PCT: 6 %
Monocyte #: 0.7 x10 3/mm (ref 0.2–0.9)
NEUTROS ABS: 9.9 10*3/uL — AB (ref 1.4–6.5)
Neutrophil %: 86.4 %
Platelet: 134 10*3/uL — ABNORMAL LOW (ref 150–440)
RBC: 3.24 10*6/uL — ABNORMAL LOW (ref 3.80–5.20)
RDW: 16.2 % — ABNORMAL HIGH (ref 11.5–14.5)
WBC: 11.5 10*3/uL — AB (ref 3.6–11.0)

## 2014-06-22 LAB — BASIC METABOLIC PANEL
ANION GAP: 4 — AB (ref 7–16)
BUN: 21 mg/dL — ABNORMAL HIGH
Calcium, Total: 8 mg/dL — ABNORMAL LOW
Chloride: 108 mmol/L
Co2: 29 mmol/L
Creatinine: 0.79 mg/dL
Glucose: 89 mg/dL
POTASSIUM: 4.2 mmol/L
Sodium: 141 mmol/L

## 2014-06-22 LAB — URINE CULTURE

## 2014-06-25 LAB — CULTURE, BLOOD (SINGLE)

## 2014-06-25 LAB — CLOSTRIDIUM DIFFICILE(ARMC)

## 2014-07-02 NOTE — Discharge Summary (Signed)
PATIENT NAME:  Denise Duke, LEPAGE MR#:  [id number] DATE OF BIRTH:  1927-08-08  DATE OF ADMISSION:  01/09/2012 DATE OF DISCHARGE:  01/10/2012   PRIMARY CARE PHYSICIAN: Rolm Gala, MD   DISCHARGE DIAGNOSES:  1. Musculoskeletal lower left chest pain.  2. Elevated troponin.  3. Chronic pain syndrome.  4. Chronic COPD with chronic respiratory failure.   CONSULTS: None.   IMAGING STUDIES:  1. CT of the abdomen and pelvis with contrast which showed severe pulmonary interstitial fibrosis. No acute intracranial abnormality.  2. Chest, PA and lateral, showed no acute infiltrate.   ADMITTING HISTORY AND PHYSICAL: Please see detailed history and physical dictated by Dr. Mordecai Maes. In brief, this is an 81 year old Caucasian female patient with pulmonary fibrosis, COPD, and chronic respiratory failure on 2 liters oxygen at home who presented to the Emergency Room complaining of left-sided lower chest pain and flank pain. The patient had a troponin of 0.06 and was admitted to the hospitalist service for ruling out acute coronary syndrome after CT abdomen showed no acute intraabdominal pathology.   HOSPITAL COURSE:  1. Left-sided chest pain. The patient had a troponin which was checked which was 0.06, 0.05, 0.07 with MB negative. The patient does not have any history of coronary artery disease. She does have COPD exacerbation but is doing well on 2 liters oxygen. She had an EKG which showed no acute changes. Her pain in the lower chest and abdomen was pleuritic worsening on movement and was diagnosed as musculoskeletal chest pain and is being discharged home in fair condition.  2. The patient does have chronic respiratory failure with chronic wheezing and COPD which seems to be at baseline with no acute infiltrates on the chest x-ray. She does have severe pulmonary fibrosis.   On the day of discharge, the patient's vitals show a temperature of 97.7, pulse 80, blood pressure 131/82, saturating 98% on 2 liters  and is being discharged home in fair condition.   DISCHARGE MEDICATIONS:  1. Prednisone 5 mg oral once a day.  2. Singulair 10 mg oral once a day.  3. Albuterol nebulizer every four hours as needed.  4. Alendronate 70 mg oral once a week on Saturday.  5. Atorvastatin 80 mg oral once a day.  6. Calcarb 600/400 one tablet twice a day.  7. Celecoxib 200 mg oral once a day.  8. Lorazepam 0.5 mg oral 3 times a day as needed for anxiety. 9. Plavix 75 mg oral once a day.  10. Advair Diskus 250/50 one puff inhaled twice a day.  11. Lasix 40 mg oral 2 times a day. 12. Gabapentin 600 mg oral 2 times a day as needed for nerve pain.  13. Acetaminophen/hydrocodone 500/5 one tablet orally every four hours as needed for pain.  14. Remicade 100 mg intravenous every five weeks.  15. Combivent Respimat 1 puff inhaled 4 times a day.  16. Lactobacillus 1 capsule oral once a day.  17. Leflunomide 1 tablet oral once a day.  18. Magnesium oxide 400 mg oral once a day.  19. Multivitamin 1 tablet oral once a day.  20. Omeprazole 20 mg oral 2 times a day.  21. Paroxetine 20 mg oral once a day.  22. Potassium chloride 20 mEq oral once a day.  23. Lyrica 50 mg oral 3 times a day.  24. Ropinirole 0.5 mg oral once a day.  25. Trazodone 50 mg oral once a day.   DISCHARGE INSTRUCTIONS:  1. The patient has been instructed  to use hot packs for her pain.  2. She is to return to the Emergency Room if she has any worsening shortness of breath or pain.  3. She will be on a cardiac diet. 4. Activity as tolerated.  5. Continue home oxygen at 2 liters.  6. The patient does have severe pulmonary fibrosis and COPD and has high risk of further pneumonias and worsening respiratory failure and I've asked her to follow-up with her primary care physician within a week.   This plan was discussed with the patient who has verbalized understanding and is okay with the plan.   TIME SPENT: Time spent today on this discharge  dictation along with coordinating care and counseling of the patient was 28 minutes.   ____________________________ Molinda Bailiff Belva Koziel, MD srs:drc D: 01/10/2012 14:00:59 ET T: 01/11/2012 10:17:17 ET JOB#: [id number]  cc: Wardell Heath R. Elpidio Anis, MD, <Dictator> Letitia Caul, MD Orie Fisherman MD ELECTRONICALLY SIGNED 01/12/2012 8:38

## 2014-07-02 NOTE — H&P (Signed)
PATIENT NAME:  Denise Duke, CRIOLLO MR#:  [id number] DATE OF BIRTH:  1927-11-12  DATE OF ADMISSION:  01/09/2012  PRIMARY CARE PHYSICIAN: Dr. Rolm Gala   CHIEF COMPLAINT: Left flank pain.   HISTORY OF PRESENT ILLNESS: Denise Duke is a very nice 81 year old female who has history of multiple medical problems including chronic obstructive pulmonary disease and oxygen dependent due to secondary hand smoking, rheumatoid arthritis, hypertension, hyperlipidemia, history of transient ischemic attacks, and gastroesophageal reflux. The patient has history of being admitted in the past due to significant Clostridium difficile and urinary infection with sepsis for what she was in the Intensive Care Unit significantly sick in October 2012. The patient comes today with a history of flank pain that started on Friday, two days ago, with intensity of 8/10. Right now the pain has subsided with intensity of 4 to 5/10. The pain is located again in the left flank. There is no significant radiation to the front or back. The pain goes from the rib cage down to parallel line to the umbilicus. The pain gets worse when she walks or moves around or takes a deep breathing. Patient has pain that is described as sharp and it has been improved with pain medications, especially Norco. Patient was evaluated in the ER, several tests was done. Chest x-ray, CT scan of the of the abdomen showed right rib fracture. Patient's pain is on the left side. Also has no acute abnormalities and significant chronic changes of the lung parenchyma due to her chronic obstructive pulmonary disease with pleural thickening on the left side. To compare chest x-ray from the past showed some type of density on the left lower lobe which is likely due to pleural thickening and that is unchanged. Patient is admitted mostly because cardiac enzymes were positive and her pain is intractable. There has not been changes in her EKG, no ST depression or elevation, but she does  have PVCs. There are some changes that could be related to previous myocardial infarction although the patient does not have a history of coronary artery disease.   REVIEW OF SYSTEMS: 12 system review of systems is done. CONSTITUTIONAL: No fever. No fatigue. No significant weakness. No weight loss or weight gain. EYES: No blurry vision. No inflammation. No redness. ENT: No tinnitus. No difficulty swallowing. No postnasal drip. RESPIRATORY: Positive chronic obstructive pulmonary disease, positive oxygen dependence. Occasional wheezing. No hemoptysis. No pneumonia lately. No changes in cough or sputum. CARDIOVASCULAR: No chest pain, orthopnea, edema, arrhythmia, syncope, or palpitations. GASTROINTESTINAL: No nausea, vomiting, diarrhea. No constipation. No abdominal pain. Positive flank pain. No jaundice. No melena or hematochezia. GENITOURINARY: No dysuria, hematuria. No increase in frequency. No breast masses. ENDOCRINE: No polyuria, polydipsia, polyphagia. No cold or heat intolerance. No thyroid problems. HEME/LYMPH: No anemia. No easy bruising. No swollen glands. SKIN: No skin lesions, acne, or rashes. MUSCULOSKELETAL: Positive aches and pains on several joints due to rheumatoid arthritis and osteoarthritis. No gout. No signs of infection or joint effusions. NEUROLOGICAL: No numbness, no significant weakness. She has history of transient ischemic attack. No dementia. PSYCHIATRIC: No significant anxiety or agitation. No bipolar disorder.   PAST MEDICAL HISTORY:  1. Chronic obstructive pulmonary disease.  2. Oxygen dependency.  3. Hypertension.  4. Hyperlipidemia.  5. Rheumatoid arthritis.  6. History of transient ischemic attack.  7. Mild anxiety.  8. Gastroesophageal reflux disease.   PAST SURGICAL HISTORY:  1. Hysterectomy.  2. Cholecystectomy.  3. Left knee surgery.  4. Hernia repair.  5. Bilateral  foot surgery.   ALLERGIES: Aspirin, Levaquin and sulfa.   SOCIAL HISTORY: Patient lives with  her daughter. She used to dip snuff but never smoked. She was secondhand smoker due to her husband's habit and she is a widow now. She denies any alcohol or drug abuse.   FAMILY HISTORY: Positive for myocardial infarction and her father. Denies any cancer. Denies any strokes.   CURRENT MEDICATIONS:  1. Prednisone 5 mg daily. 2. Acetaminophen p.r.n.  3. Aspirin 81 mg daily.  4. Norco 5/325 mg p.r.n. pain. 5. Clonazepam 0.25 mg four times a day. 6. Gabapentin 600 mg twice daily.  7. Lyrica 50 mg 2 times daily.  8. Paroxetine 20 mg once daily.  9. Requip 0.25 mg at bedtime. 10. Plavix 75 mg once daily.  11. Alendronate 70 mg once a week.  12. Advair Diskus 250/50, 1 to 2 times a day.  13. Fluticasone salmeterol 1 puff twice daily.  14. Spiriva 18 mcg once daily.  15. Nystatin powder.  16. Zinc oxide cream.  17. Leflunomide 10 mg once daily.  18. Remicade 100 mg every five weeks. 19. Singular 10 mg daily.  20. Robaxin 500 mg 4 times a day. 21.  500 mg twice daily.  22. Lactobacillus.  23. Omeprazole 20 mg once daily.  24. Os-Cal D.   PHYSICAL EXAMINATION:  VITAL SIGNS: Blood pressure 132/81, pulse 82, respiratory rate 24, oxygen saturation 97% on 2 liters oxygen, she is afebrile.   GENERAL: Patient is alert, oriented x3. No acute distress. No respiratory distress.   HEENT: Pupils are equal and reactive. Extraocular movements intact. Mucosa are moist. No oral lesions. No oropharyngeal exudates. Anicteric sclerae. Pink conjunctivae.   NECK: Supple. No JVD. No thyromegaly. No adenopathy. No auscultated carotid bruits. No palpable masses.   CARDIOVASCULAR: Regular rate and rhythm. No murmurs, rubs, or gallops. No displacement. PMI. No thrills palpable.  LUNGS: Diffuse rhonchi, which is chronic in the patient. No wheezing. No dullness to percussion. No use of accessory muscles. Patient on 2 liters of oxygen which is her home requirements.   ABDOMEN: Soft, nontender, nondistended. No  hepatosplenomegaly. No masses. Positive pain to palpation to the left flank site. No costovertebral angle tenderness. The pain is located in the rib cage down to parallel to umbilical scar. Reproducible with palpation, worse with movement of the torso and core muscles.   GENITAL: Negative for external lesions.   EXTREMITIES: No edema, no cyanosis, no clubbing. Pulses +2. Capillary refill less than 3.  NEUROLOGICAL: Cranial nerves II through XII intact. Strength 5/5 in four extremities. Deep tendon reflexes +2.   SKIN: Without any significant lesions, petechiae or rashes.  MUSCULOSKELETAL: No significant joint effusions. No pain on spinal processes.   PSYCH: Mood is normal without any signs of depression, anxiety.   LYMPHATICS: Negative for lymphadenopathy in neck or supraclavicular area.   LABORATORY, DIAGNOSTIC AND RADIOLOGICAL DATA: EKG: Normal sinus rhythm, PVCs. No ST depression or elevation. Glucose 115. Electrolytes within normal limits. Creatinine 0.87, troponin 0.06, mildly elevated. Normal LFTs. White count 4.1, hemoglobin 12.1, platelets 150. Urinalysis with normal limits. No signs of infection. CT scan as mentioned in history of present illness.   ASSESSMENT AND PLAN: This is an 81 year old female with history of multiple medical problems, chronic obstructive pulmonary disease, hypertension, dyslipidemia, rheumatoid arthritis, transient ischemic attack, gastroesophageal reflux disease, anxiety and weakness admitted due to flank pain with positive troponin.  1. Flank pain. Patient has likely musculoskeletal pain which has been treated with  Norco and the medication is working but not taking care of the pain. The patient is admitted for intractable pain. There is reproducible pain with palpation. No signs of urine infection. No signs of intra-abdominal pathology or new pulmonary pathology. Lidocaine patch applied to flank.  2. Elevated troponin. Patient has multiple risk factors for  coronary artery disease. She has been though evaluated in the past by Dr. Lady Gary and she has been told that she is not a good candidate for procedures. She has been seen by cardiology for the same reason with elevated troponin but at that point a year ago was due to sepsis and renal failure. At this moment we are going to do serial troponins and see if the patient has any further elevation. We are not going to consult cardiology unless there is change in her troponins or change in symptomatology. I don't think there is any need of echocardiogram at this moment either.   3. Transient ischemic attack. Patient currently taking Plavix. Continue that medication.  4. Rheumatoid arthritis. Continue treatment with leflunomide and Remicade.  5. Hypertension. Stable.  6. GI prophylaxis with PPI.  7. Deep vein thrombosis prophylaxis. Heparin prophylactic dose.  8. Patient is a FULL CODE.   TIME SPENT: I spent about 45 minutes with this admission.  ____________________________ Felipa Furnace, MD rsg:cms D: 01/09/2012 21:48:32 ET T: 01/10/2012 06:27:51 ET JOB#: [id number]  cc: Felipa Furnace, MD, <Dictator> Letitia Caul, MD Pearletha Furl MD ELECTRONICALLY SIGNED 01/10/2012 14:38

## 2014-07-06 NOTE — Consult Note (Signed)
PATIENT NAME:  Denise Duke, Denise Duke MR#:  [id number] DATE OF BIRTH:  27-Apr-1927  DATE OF CONSULTATION:  10/30/2013   CONSULTING PHYSICIAN:  Chayna Surratt D. Seher Schlagel, MD   INDICATION: Chest pain, shortness of breath.    HISTORY OF PRESENT ILLNESS:  The patient is an 81 year old female with known COPD, hypertension, and rheumatoid arthritis, followed by Dr. Gus Puma received a Remicade infusion.   While getting the infusion, she started getting short of breath with tightness in the chest.  Infusion was stopped.  She came to the Emergency Room. While in the Emergency Room she was still having some chest tightness.  The patient is being admitted for further evaluation because of chest tightness and some nausea with shortness of breath.  It was thought that they did not feel this was an allergic reaction.  EKG showed some Q-waves anteriorly, so she was admitted for further evaluation and care because the patient short of breath and having some wheezing.   PAST MEDICAL HISTORY:  COPD, hypertension, hyperlipidemia, rheumatoid arthritis TIA, mild anxiety, GERD.   PAST SURGICAL HISTORY: Hysterectomy, cholecystectomy, knee surgery, hernia repair, bilateral foot surgery.   SOCIAL HISTORY: Denies smoking or alcohol consumption.   FAMILY HISTORY: Myocardial infarction.   ALLERGIES: ASPIRIN, KEFLEX, LEVAQUIN, SULFA.   MEDICATIONS: Advair 250/50 twice a day, Lipitor 80 a day, calcium twice a day, Coreg 3.125 twice a day,  Celebrex 200 a day,  Plavix 75 a day, Combivent 6 times a day, cranberry capsule once a day, Lasix 20 mg a day, gabapentin 600 at 3 times a day,  Klonopin 0.5 at 3 times a day, lisinopril 2.5 daily, Lyrica 25 mg 2 times a day, Singulair once a day, multivitamin once a day, omeprazole 20 a day, Paxil 20 a day, potassium 10 mEq a day, prednisone 2.5 alternating with 5 daily, probiotic daily.   REVIEW OF SYSTEMS:  No blackout spells or syncope.  Denies nausea or vomiting. No fever. No chills. No sweats. No  weight loss. No weight gain, hemoptysis, hematemesis. Denies bright red blood per rectum. She has had shortness of breath, dyspnea with chest pain.   PHYSICAL EXAMINATION:  VITAL SIGNS: Blood pressure was about 110/70, pulse 75, respiratory rate 16, afebrile.  HEENT: Normocephalic, atraumatic. Pupils equal and reactive to light.  NECK: Supple. No significant JVD, bruits or adenopathy.  LUNGS: Clear to auscultation and percussion with some mild wheezing and rhonchi bilaterally.  HEART: Regular rate and rhythm.  Systolic ejection murmur at the apex.  PMI nondisplaced.  ABDOMEN: Benign.  EXTREMITIES: Within normal limits.  NEUROLOGIC: Intact.  SKIN: Normal.   LABORATORY DATA: BNP normal. CBC normal. Hemoglobin is 11.1, hematocrit 34, coags are negative.   EKG: Normal sinus rhythm, Q waves anteriorly, right bundle branch block. Chest x-ray possible pulmonary fibrosis. Coag panel is negative.   IMPRESSIONS:  1.  Unstable angina. 2.  Chest pain. 3.  Chronic obstructive pulmonary disease. 4.  Hypertension. 5.  Anxiety. 6.  Depression. 7.  Rheumatoid arthritis. 8.  Hyperlipidemia. 9.  Gastroesophageal reflux disease.   PLAN:  1.  Agree with admit. Rule out for myocardial infarction, follow-up cardiac enzymes. Follow-up EKG, recommend telemetry.  2.  Mild hydration.  3.  Continue inhalers for chronic obstructive pulmonary disease, shortness of breath.  4.  Continue Lasix therapy for possible mild failure.  5.  Probably pulmonary fibrosis, so aggressive therapy in that regard will be necessary.  6.  Consider increasing prednisone therapy in the interim.  7.  Continue omeprazole  for reflux symptoms.  8.  Continue atorvastatin for hyperlipidemia.  9.  Continue Klonopin for anxiety symptoms.  Echocardiogram would be helpful.  We will defer functional study for now.  We will base further evaluation on the results of the echocardiogram and how the patient responds to intermediate therapy.      ____________________________ Bobbie Stack. Juliann Pares, MD ddc:DT D: 11/12/2013 15:41:00 ET T: 11/12/2013 16:36:19 ET JOB#: [id number]  cc: Miquan Tandon D. Juliann Pares, MD, <Dictator> Alwyn Pea MD ELECTRONICALLY SIGNED 11/15/2013 12:54

## 2014-07-06 NOTE — H&P (Signed)
PATIENT NAME:  Denise Duke, DEMATTEO MR#:  [id number] DATE OF BIRTH:  1927/09/06  DATE OF ADMISSION:  10/29/2013  PRIMARY CARE PHYSICIAN: Letitia Caul, MD at [facility], [place].  RHEUMATOLOGY: Dessie Coma., MD  REQUESTING PHYSICIAN: Kathreen Devoid. Paduchowski, MD  CHIEF COMPLAINT: Chest tightness.   HISTORY OF PRESENT ILLNESS: The patient is an 81 year old female with a known history of COPD, hypertension, and rheumatoid arthritis followed by Dr. Saverio Danker who was at [facility] for a Remicade infusion. While getting infusion, she started getting short of breath and started feeling tightness of the chest for which the infusion was stopped and she was requested to come to the Emergency Department. While in the ED, she was still having some chest tightness for which she is being admitted for further evaluation and management. Her chest tightness was sudden onset, associated with shortness of breath and mild episode of nausea. Dr.  Gavin Potters did not feel this was allergic or infusion reaction. Her EKG showed some Q waves in lead V1, V2, and V3, and she is being admitted for further evaluation and management. She did have some wheezing.   PAST MEDICAL HISTORY:  1.  Chronic obstructive pulmonary disease.  2.  Hypertension.  3.  Hyperlipidemia.  4.  Rheumatoid arthritis.  5.  History of TIA.  6.  Mild anxiety.  7.  GERD.   PAST SURGICAL HISTORY: 1.  Hysterectomy.  2.  Cholecystectomy.  3.  Left knee surgery.  4.  Hernia repair.  5.  Bilateral foot surgery.   SOCIAL HISTORY: No smoking, alcohol, or illicit drug use.   FAMILY HISTORY: Positive for MI in father.   ALLERGIES: ASPIRIN, KEFLEX, LEVAQUIN, AND SULFA DRUGS.   MEDICATIONS AT HOME:  1.  Advair 250/50, 1 inhalation twice a day.  2.  Atorvastatin 80 mg p.o. at bedtime.  3.  Calcium carbonate and vitamin D 1 tablet p.o. twice a day.  4.  Coreg 3.125 mg p.o. b.i.d. 5.  Celecoxib 200 mg p.o. daily.  6.   Plavix 75 mg p.o. daily.  7.  Combivent inhalation 6 times a day as needed.  8.  Cranberry capsule once a day.  9.  Lasix 20 mg p.o. daily.  10.  Gabapentin 600 mg p.o. 3 times a day.  11.  Klonopin 0.5 mg p.o. 3 times a day as needed.  12.  Lisinopril 2.5 mg p.o. daily. 13.  Lyrica 25 mg p.o. 3 times a day.  14.  Singulair once daily.  15.  Multivitamin once daily.  16.  Omeprazole 20 mg p.o. daily.  17.  Paroxetine 20 mg p.o. daily.  18.  Potassium chloride 10 mEq p.o. daily. 19.  Prednisone 2.5 mg p.o. alternating with 5 mg p.o. daily.  20.  Probiotic formula once daily.   REVIEW OF SYSTEMS:  CONSTITUTIONAL: No fever, fatigue, weakness.  EYES: No blurry or double vision.  ENT: No tinnitus or ear pain.  RESPIRATORY: No cough, wheezing, hemoptysis.  CARDIOVASCULAR: Positive for chest tightness associated with shortness of breath while getting Remicade infusion, none now. No orthopnea or edema.  GASTROINTESTINAL: Transient nausea, now resolved. No vomiting or diarrhea.  GENITOURINARY: No dysuria or hematuria.  ENDOCRINE: No polyuria or nocturia.  HEMATOLOGY: No anemia or easy bruising.  SKIN: No rash or lesion.  MUSCULOSKELETAL: History of rheumatoid arthritis.  NEUROLOGIC: History of TIA.  PSYCHIATRY: History of mild anxiety.   PHYSICAL EXAMINATION:  VITAL SIGNS: Temperature 97.8, heart rate 71 per minute,  respirations 18 per minute, blood pressure 100/60 mmHg. She is saturating 96% on 3 L of oxygen via nasal cannula.  GENERAL: The patient is an 81 year old female lying in the bed comfortably without any acute distress.  EYES: Pupils equal, round, reactive to light and accommodation. No scleral icterus. Extraocular muscles intact.  HEENT: Head atraumatic, normocephalic. Oropharynx and nasopharynx clear.  NECK: Supple. No jugular venous distention. No thyroid enlargement or tenderness.  LUNGS: Clear to auscultation.  LUNGS: Wheezing throughout both lungs bilaterally with  occasional rhonchi at the bases.  CARDIOVASCULAR: S1, S2 normal. No murmurs, rubs, gallops.  ABDOMEN: Soft, nontender, nondistended. Bowel sounds present. No organomegaly or masses.  EXTREMITIES: No pedal edema, cyanosis, or clubbing.  NEUROLOGIC: Cranial nerves II through XII intact. Muscle strength 5/5 in all extremities. Sensation intact.  PSYCHIATRIC: The patient is alert and oriented x 3.  SKIN: No obvious rash, lesion, ulcer.  MUSCULOSKELETAL: No joint effusion.   LABORATORY PANEL: Normal BMP. Normal first set of troponins. Normal CBC except hemoglobin of 11.1, hematocrit 34.2. Normal coagulation panel.   Chest x-ray in the ED showed no acute findings. Pulmonary fibrosis seen. EKG showed normal sinus rhythm with Q waves in lead V1, V2, and V3 with incomplete right bundle branch block.   IMPRESSION AND PLAN:  1.  Suspected unstable angina while getting Remicade infusion with some chest pressure, shortness of breath, and nausea. Seems more of infusion reaction, although Dr. Lavenia Atlas did not feel the same, so we will admit her under observation, rule her out with serial troponins, get Myoview in the morning, get cardiology consultation. This certainly could be due to somewhat fluid overload along with some bronchospasm, as she does have some wheezing.  2.  Chronic obstructive pulmonary disease with mild exacerbation with some wheezing. We will continue her Combivent, add a small dose of IV Solu-Medrol to see if that helps her breathing. Will add DuoNeb breathing treatment, also, while she is awake.  3.  Hypertension. We will continue her home blood pressure medication, including Coreg, Lasix, and lisinopril. 4.  Anxiety/depression. We will continue her Klonopin at this time along with Paxil. 5.  Rheumatoid arthritis. We will continue her prednisone at her home dosages.  CODE STATUS: Full code.  TOTAL TIME TAKING CARE OF THIS PATIENT: 45 minutes.   ____________________________ Ellamae Sia. Sherryll Burger, MD vss:ST D: 10/29/2013 23:06:46 ET T: 10/30/2013 01:04:02 ET JOB#: [id number]  cc: Alexandr Oehler S. Sherryll Burger, MD, <Dictator> Letitia Caul, MD Helen Hashimoto Royann Shivers., MD Lamar Blinks, MD Ellamae Sia [facility] MD ELECTRONICALLY SIGNED 11/08/2013 13:25

## 2014-07-06 NOTE — Discharge Summary (Signed)
PATIENT NAME:  Denise Duke, Denise Duke MR#:  [id number] DATE OF BIRTH:  11-05-1927  DATE OF ADMISSION:  03/31/2013 DATE OF DISCHARGE:  04/03/2013  PRIMARY CARE PHYSICIAN: Rolm Gala, MD   CHIEF COMPLAINT: Shortness of breath.   DISCHARGE DIAGNOSES: 1.  Interstitial pneumonia in setting of chronic lung disease.  2.  Acute renal failure, resolved.  3.  Systemic inflammatory response syndrome.  4.  Chronic anemia.  5.  Transient thrombocytopenia.  6.  Chronic respiratory failure on 2 liters of oxygen.  7.  Hypertension.  8.  Hyperlipidemia.  9.  Rheumatoid arthritis.  10.  History of transient ischemic attack.  11.  Anxiety.  12.  Gastroesophageal reflux disease.  DISCHARGE MEDICATIONS:  1.  Alendronate 70 mg once a week on Fridays. 2.  Atorvastatin 80 mg daily. 3.  Calcarb with D 600/400 international units 2 times a day. 4.  Celecoxib 200 mg once a day. 5.  Advair 250/50 mcg 2 times a day. 6.  Gabapentin 600 mg 2 times a day.  7.  Multivitamin 1 tab daily. 8.  Omeprazole 20 mg daily. 9.  Paroxetine 20 mg daily. 10.  Ropinirole 0.5 mg at bedtime. 11.  Clopidogrel 75 mg daily. 12.  Montelukast 10 mg daily. 13.  Ipratropium 18 mcg inhaled 1 cap once a day. 14.  Combivent 2 puffs 4 times a day as needed. 15.  DuoNeb 1 vial 4 times a day as needed for shortness of breath. 16.  Carvedilol 3.125 mg 2 times a day. 17.  Lisinopril 2.5 mg daily. 18.  Furosemide 20 mg every 72 hours. 19.  Clonazepam 0.5 mg 3 times a day as needed for anxiety. 20.  Azithromycin 500 mg every 24 hours for 7 days.  21.  Tussionex 5 mL 12 hours extended-release as needed for cough. 22.  Prednisone 50 mg for 2 days then taper x 10 mg every 2 days until done in 10 days total.  HOME OXYGEN: To rehab with 2 liters of oxygen via nasal cannula.  DIET: Low sodium.   ACTIVITY: As tolerated.   DISCHARGE FOLLOWUP: Please follow with PCP and Dr. Meredeth Ide, your pulmonologist, within 1 to 2 weeks.   SIGNIFICANT LABS  AND IMAGING: Blood cultures on arrival: No growth to date. UA did not suggest an infection. Initial white count 14,000, hemoglobin 11.5, and platelets 173. TSH 0.65. Troponin negative. Initial creatinine 1.68, sodium 128. Initial creatinine on January 18 was 1.15 with a sodium of 136. X-ray had showed interstitial pneumonia with marked low lung volumes in the setting of chronic interstitial lung disease. No definitive acute cardiopulmonary disease noted.  HISTORY OF PRESENT ILLNESS AND HOSPITAL COURSE:  For full details of H and P, please see the dictation on January 18 by Dr. Imogene Burn, but briefly this is an 81 year old female with multiple comorbidities, chronic respiratory failure on 2 liters of oxygen who came in for shortness of breath and cough for 2 months worsening for the past week, but also some hypotension which improved with IV fluids. She was admitted to the hospitalist service, started on nebs Spiriva, and Advair as well as antibiotics. Blood cultures were sent, which have been negative. She was maintained on azithromycin for interstitial pneumonia. The patient has done significantly better in the last couple of days. She is still on steroids, but will start to taper starting today. Her blood pressures improved with fluids. She had mild hyponatremia on arrival, which has resolved, as well as her acute renal failure which has  resolved. We will continue her outpatient medications at this point. She is on her regular 2 liters of oxygen. We suspect the patient had interstitial pneumonia in the setting of chronic interstitial lung disease. She usually follows with Dr. Meredeth Ide and she is recommended to follow with Dr. Meredeth Ide as an outpatient for continuation of care. Her systemic inflammatory response syndrome has resolved, although her white count today is slightly higher than yesterday, which has gone from 6 to 12. I suspect that that is in the setting of IV steroids kicking in. She has no fever and is on  her outpatient 2 liters of oxygen at this point.   DISCHARGE PHYSICAL EXAMINATION: VITAL SIGNS: Her temperature is 97.9, pulse rate 90, respiratory rate 20, blood pressure 112/68, and O2 sat 95% on 2 liters. GENERAL: The patient is a frail, elderly female lying in bed talking in full sentences, not acutely distressed.  HEAD: Normocephalic, atraumatic.  LUNGS: Bilaterally showing diffuse crackles at the bases.  HEART: Regular rate and rhythm. ABDOMEN: Soft, nontender, and nondistended. EXTREMITIES: No pitting edema.   CODE STATUS: The patient is DNR.   TOTAL TIME SPENT: 35 minutes.  ____________________________ Krystal Eaton, MD sa:sb D: 04/03/2013 15:09:49 ET T: 04/03/2013 15:24:22 ET JOB#: [id number]  cc: Krystal Eaton, MD, <Dictator> Letitia Caul, MD Herbon E. Meredeth Ide, MD Krystal Eaton MD ELECTRONICALLY SIGNED 04/28/2013 10:46

## 2014-07-06 NOTE — Discharge Summary (Signed)
PATIENT NAME:  Denise Duke, HOBBINS MR#:  [id number] DATE OF BIRTH:  Nov 02, 1927  ADMITTING PHYSICIAN: Vipul S. Sherryll Burger, MD  DISCHARGING PHYSICIAN: Enid Baas, MD   PATIENT'S PRIMARY MEDICAL DOCTOR: Letitia Caul, MD at [facility] in [place].   PRIMARY RHEUMATOLOGIST:  Dessie Coma., MD  PRIMARY CARDIOLOGIST: Lamar Blinks, MD  CONSULTATIONS IN THE HOSPITAL:   [facility] Cardiology consultation.   DISCHARGE DIAGNOSES: 1.  Chest pressure secondary to pulmonary vascular congestion.  2.  Acute on chronic diastolic congestive heart failure exacerbation.  3. Chronic respiratory failure secondary to interstitial lung disease and chronic obstructive pulmonary disease on 3 liters home oxygen.  4.  Hypertension.  5.  Hyperlipidemia.  6.  Rheumatoid arthritis.  7.  Anxiety and depression.  8.  Gastroesophageal reflux disease.   DISCHARGE MEDICATIONS: 1.  Atorvastatin 80 mg p.o. at bedtime.  2.  Calcium carbonate 600 mg/400 International Units 1 tablet p.o. b.i.d.  3.  Celecoxib 200mg  p.o. daily.  4.  Advair 250/50 one puff b.i.d.  5.  Multivitamin 1 tablet p.o. daily.  6.  Omeprazole 20 mg p.o. daily.  7.  Paroxetine 20 mg p.o. daily.  8.  Ropinirole 0.5 mg p.o. at bedtime.  9.  Plavix 75 mg p.o. daily.  10.  Montelukast 10 mg p.o. at bedtime.  11.  Coreg 3.125 mg p.o. b.i.d.  12.  Lisinopril 2.5 mg p.o. daily.  13.  Lasix 20 mg p.o. daily.  14.  Gabapentin 600 mg p.o. 3 times a day.  15.  Combivent Respimat 1 puff as needed for shortness of breath.  16.  Klonopin 0.5 mg 1 tablet orally 3 times a day as needed for anxiety.  17.  Lyrica 25 mg p.o. t.i.d.  18.  Potassium chloride 10 mEq p.o. daily.  19.  Probiotic 1 capsule p.o. daily.  20.  Cranberry oral capsule p.o. daily.  21.  Prednisone 5 mg p.o. every other day.  22.  Prednisone 2.5 mg every other day.   DISCHARGE DIET: Low-sodium diet.   DISCHARGE ACTIVITY: As tolerated.  HOME OXYGEN: Two  liters.  FOLLOWUP INSTRUCTIONS: 1.  Follow up with Dr. in 1 week.  2.  PCP followup in 2 weeks.  3. Rheumatology followup in 1 week.   LABORATORY DATA AND IMAGING STUDIES PRIOR TO DISCHARGE: WBC 6.4, hemoglobin 10.4, hematocrit 31.2, platelet count 148,000. Sodium 141, potassium 4.7, chloride 105, bicarbonate 26, BUN 26, creatinine 1.05, glucose 125, and calcium of 7.7. Troponins are negative. Chest x-ray showing no acute finding. Pulmonary fibrosis seen. This was on admission and repeat chest x-ray PA and lateral showing chronic interstitial disease with peripheral basilar predominance. No pleural effusion nor pneumothorax seen. There is enlargement of cardiac silhouette with pulmonary vascular congestion. Myoview scan showing no significant wall motion abnormality. Perfusion study with no significant ischemia. EF is 75%. No EKG changes concerning for ischemia; normal study.   BRIEF HOSPITAL COURSE: Ms. Denise Duke is an elderly, 81 year old, pleasant Caucasian female with past medical history significant for rheumatoid arthritis, chronic respiratory failure secondary to COPD, interstitial lung disease, on home oxygen, who follows with Dr. 97 for Remicade infusions. Went for her infusion, however, developed chest tightness as soon as the medicine was started, so was sent over to the hospital to rule out cardiac causes.  1.  Chest tightness with the Remicade infusion, less likely to be a medication reaction, probably has some pulmonary vascular congestion as noted on her chest x-ray; however, it  difficult to differentiate because she has significant vascular and pulmonary interstitial lung disease and fibrosis.  Her symptoms improved with 1 dose of Lasix, has not had further chest pain. She was ruled out for MI as her troponins were negative. Myoview was done and it was completely normal. She was seen by Dr. Juliann Pares and she will follow up with Dr. Gwen Pounds as an outpatient.  She will be ambulated  prior to discharge to make sure symptoms do not recur.  2. Chronic respiratory failure secondary to interstitial lung disease, likely from rheumatoid arthritis, and COPD. Continue home oxygen. Continue inhalers.  3.  Chronic diastolic CHF. No prior ejection fraction or echocardiogram done here. EF based on my review is 75%.  The patient already on cardiac medications including Plavix, Coreg, lisinopril, and statin, which are being continued at this time. She is also on Lasix and potassium supplements as well.  4.  Rheumatoid arthritis, on prednisone every day and also follows with rheumatology for Remicade infusions, which she can continue.  5.  Depression, anxiety. Continue her home medications.   Her course has been otherwise uneventful in the hospital.   DISCHARGE CONDITION: Stable.   DISCHARGE DISPOSITION:  Home.   TIME SPENT ON DISCHARGE: Forty minutes.   ____________________________ Enid Baas, MD rk:LT D: 10/30/2013 14:23:23 ET T: 10/30/2013 17:32:22 ET JOB#: [id number]  cc: Enid Baas, MD, <Dictator> Letitia Caul, MD Dessie Coma., MD Lamar Blinks, MD  Enid Baas MD ELECTRONICALLY SIGNED 11/02/2013 13:55

## 2014-07-06 NOTE — H&P (Signed)
PATIENT NAME:  Denise Duke, Denise Duke MR#:  [id number] DATE OF BIRTH:  1928/03/10  DATE OF ADMISSION:  03/31/2013  PRIMARY CARE PHYSICIAN: Letitia Caul, MD  REFERRING PHYSICIAN: Sheryl L. Mindi Junker, MD  CHIEF COMPLAINT: Shortness of breath, cough for 2 months, worsening for 1 week.   HISTORY OF PRESENT ILLNESS: This is an 81 year old Caucasian female with a history of COPD on home oxygen, hypertension, presented to the ED with shortness of breath, cough for 2 months, worsening 1 week. The patient is alert, awake, oriented, in no acute distress. According to the patient and the patient's health caregiver, the patient has had a cough, shortness of breath for the past 2 months, but the symptoms have been worsening for the past 1 week. In addition, the patient developed fever, chills, nausea, vomiting, diarrhea 4 times with loose stool. The patient feels weak, poor appetite, but the patient denies any leg edema, orthopnea, or nocturnal dyspnea. The patient's blood pressure was low at 77/46, was treated with normal saline and increased to 96/54. The patient's chest x-ray showed interstitial pneumonia. The patient was treated with Zithromax and Rocephin in ED.   PAST MEDICAL HISTORY: COPD on home oxygen, hypertension, hyperlipidemia, rheumatoid arthritis, TIA, mild anxiety, GERD.   PAST SURGICAL HISTORY: Hysterectomy, cholecystectomy, left knee surgery, hernia repair, bilateral foot surgery.   SOCIAL HISTORY: No smoking or drinking or illicit drugs.   FAMILY HISTORY: Father had MI. Denies any other problem.   ALLERGIES: ASPIRIN, CEPHALEXIN, LEVAQUIN, SULFA DRUGS.   HOME MEDICATIONS: Medication reconciliation list is not done yet, but for now:   1.  Trazodone  50 mg p.o. at bedtime. 2.  Spiriva 18 mcg inhalation once a day. 3.  Ropinirole 0.5 mg p.o. at bedtime.  4.  Prednisone 5 mg p.o., 1/2 tablet once a day.  5.  Paroxetine 20 mg p.o. daily.  6.  Omeprazole 20 mg p.o. daily.  7.  Multivitamin 1  tablet once a day.  8.  Singulair 10 mg once a day at bedtime.  9.  Lisinopril 2.5 mg p.o. daily.  10.  Lactobacillus rhamnosus GG oral cap, 1 cap p.o. daily.  11.  Gabapentin 600 mg p.o. b.i.d.  12.  Lasix 20 mg p.o. every 72 hours.  13.  DuoNeb 2.5 mg/0.5 mg, 1 vial p.r.n. 4 times a day for shortness of breath.  14.  Combivent CFC-free 100 mcg/20 mcg, 2 puffs inhaled 4 times a day p.r.n.   15.  Plavix 75 mg p.o. daily.  16.  Clonazepam 0.5 mg p.o. t.i.d.  17.  Celecoxib 200 mg p.o. 1 cap once a day.  18.  Coreg 3.125 mg p.o. b.i.d.  19.  Calcarb with D 600 mg p.o. b.i.d.  20.  Atorvastatin 80 mg p.o. at bedtime.  21.  Alendronate 70 mg p.o. with empty stomach during breakfast, once a week on Friday. 22.  Advair 250 mcg/50 mcg inhalation 1 puff b.i.d.   REVIEW OF SYSTEMS:   CONSTITUTIONAL: The patient has fever, chills, but no headache or dizziness, but has generalized weakness and poor oral intake.  EYES: No double vision or blurry vision.  EARS, NOSE, THROAT: No postnasal drip, slurred speech or dysphagia, but has a hearing problem.  CARDIOVASCULAR: No chest pain, palpitations, orthopnea or nocturnal dyspnea. No leg edema.  PULMONARY: Positive for cough, sputum, shortness of breath, but no hemoptysis. No wheezing.  GASTROINTESTINAL: No abdominal pain, but has nausea, vomiting, diarrhea.  GENITOURINARY: No dysuria, hematuria, but has incontinence sometimes.  SKIN: No rash or jaundice.  NEUROLOGIC: No syncope, loss of consciousness or seizure.   HEMATOLOGIC: No easy bruising or bleeding.  ENDOCRINE: No polyuria, polydipsia, heat or cold intolerance.   PHYSICAL EXAMINATION: VITAL SIGNS: Temperature 99.2, blood pressure 96/55, pulse 93, oxygen saturation 93% on oxygen.  GENERAL: The patient is alert, awake, oriented, in no acute distress.  HEENT: Pupils round, equal, reactive to light and accommodation. Dry oral mucosa. Clear oropharynx.  NECK: Supple. No JVD or carotid bruits. No  lymphadenopathy. No thyromegaly.  CARDIOVASCULAR: S1, S2, regular rate, rhythm. No murmurs, gallops.  PULMONARY: Bilateral air entry. Bilateral crackles and rhonchi. No wheezing. No use of  accessory muscles to breathe.  ABDOMEN: Soft. No distention or tenderness. No organomegaly. Bowel sounds present.  EXTREMITIES: No edema, clubbing or cyanosis. No calf tenderness. Bilateral pedal pulses present.  SKIN: No rash or jaundice.  NEUROLOGIC: A and O x 3. No focal deficit. Power 5/5. Sensation intact.   LABORATORY, DIAGNOSTIC AND RADIOLOGICAL DATA: Chest x-ray showed markedly low lung volumes in the setting of chronic interstitial lung disease, which is favored to represent interstitial pneumonia.   Glucose 115, BUN 30, creatinine 1.68, sodium 128, potassium 3.8, chloride 98, bicarbonate 26. WBC 14.3, hemoglobin 11.5, platelets 173. Troponin less than 0.02.   EKG shows normal sinus rhythm with left axis deviation, pulmonary disease pattern, incomplete right bundle branch block.   IMPRESSIONS: 1.  Pneumonia.  2.  Sepsis.  3.  Hypotension.  4.  Acute renal failure.  5.  Hyponatremia.  6.  Chronic obstructive pulmonary disease.   PLAN OF TREATMENT: 1.  The patient will be admitted to medical floor. We will give Zithromax, but since THE PATIENT HAS ALLERGIES TO CEPHALEXIN AND LEVAQUIN, we cannot give Rocephin and Levaquin.  2.  We will follow up sputum culture, blood culture, and CBC.  3.  We will continue DuoNebs, Spiriva, Advair, and continue oxygen.  4.  For hypotension, we will hold hypertension medication, give normal saline IV, follow up BMP, and continue to closely monitor blood pressure.  5.  I discussed the patient's condition and plan of treatment with the patient and the patient's healthcare giver. The patient wants DO NOT RESUSCITATE.   TIME SPENT: About 55 minutes.    ____________________________ Shaune Pollack, MD qc:jcm D: 03/31/2013 14:59:15 ET T: 03/31/2013 16:29:20  ET JOB#: [id number]  cc: Shaune Pollack, MD, <Dictator> Shaune Pollack MD ELECTRONICALLY SIGNED 04/01/2013 10:35

## 2014-07-11 ENCOUNTER — Observation Stay
Admit: 2014-07-11 | Disposition: A | Discharge status: Skilled Nursing Facility | Payer: Self-pay | Attending: Specialist | Admitting: Specialist

## 2014-07-11 LAB — URINALYSIS, COMPLETE
BILIRUBIN, UR: NEGATIVE
BLOOD: NEGATIVE
Bacteria: NONE SEEN
Glucose,UR: NEGATIVE mg/dL (ref 0–75)
Ketone: NEGATIVE
Leukocyte Esterase: NEGATIVE
Nitrite: NEGATIVE
PH: 5 (ref 4.5–8.0)
Protein: NEGATIVE
Specific Gravity: 1.008 (ref 1.003–1.030)

## 2014-07-11 LAB — CBC
HCT: 35.7 % (ref 35.0–47.0)
HGB: 11.6 g/dL — ABNORMAL LOW (ref 12.0–16.0)
MCH: 30.3 pg (ref 26.0–34.0)
MCHC: 32.5 g/dL (ref 32.0–36.0)
MCV: 93 fL (ref 80–100)
Platelet: 169 10*3/uL (ref 150–440)
RBC: 3.82 10*6/uL (ref 3.80–5.20)
RDW: 16.4 % — AB (ref 11.5–14.5)
WBC: 9 10*3/uL (ref 3.6–11.0)

## 2014-07-11 LAB — CK TOTAL AND CKMB (NOT AT ARMC)
CK, TOTAL: 52 U/L
CK-MB: 2.4 ng/mL

## 2014-07-11 LAB — COMPREHENSIVE METABOLIC PANEL
ANION GAP: 6 — AB (ref 7–16)
Albumin: 3.3 g/dL — ABNORMAL LOW
Alkaline Phosphatase: 46 U/L
BUN: 16 mg/dL
Bilirubin,Total: 0.8 mg/dL
CHLORIDE: 100 mmol/L — AB
CO2: 30 mmol/L
CREATININE: 1.05 mg/dL — AB
Calcium, Total: 8.4 mg/dL — ABNORMAL LOW
EGFR (African American): 56 — ABNORMAL LOW
EGFR (Non-African Amer.): 48 — ABNORMAL LOW
Glucose: 119 mg/dL — ABNORMAL HIGH
POTASSIUM: 3.9 mmol/L
SGOT(AST): 23 U/L
SGPT (ALT): 13 U/L — ABNORMAL LOW
Sodium: 136 mmol/L
TOTAL PROTEIN: 7.1 g/dL

## 2014-07-11 LAB — PRO B NATRIURETIC PEPTIDE: B-Type Natriuretic Peptide: 119 pg/mL — ABNORMAL HIGH

## 2014-07-11 LAB — PROTIME-INR
INR: 1.1
PROTHROMBIN TIME: 14.5 s

## 2014-07-11 LAB — LACTIC ACID, PLASMA: LACTIC ACID, VENOUS: 1.7 mmol/L

## 2014-07-11 LAB — TROPONIN I: Troponin-I: 0.09 ng/mL — ABNORMAL HIGH

## 2014-07-11 LAB — APTT: ACTIVATED PTT: 24.5 s (ref 23.6–35.9)

## 2014-07-12 LAB — CBC WITH DIFFERENTIAL/PLATELET
Basophil #: 0 10*3/uL (ref 0.0–0.1)
Basophil %: 0.5 %
Eosinophil #: 0 10*3/uL (ref 0.0–0.7)
Eosinophil %: 0.3 %
HCT: 30.5 % — ABNORMAL LOW (ref 35.0–47.0)
HGB: 9.8 g/dL — ABNORMAL LOW (ref 12.0–16.0)
Lymphocyte #: 0.4 10*3/uL — ABNORMAL LOW (ref 1.0–3.6)
Lymphocyte %: 8.1 %
MCH: 30.1 pg (ref 26.0–34.0)
MCHC: 32.1 g/dL (ref 32.0–36.0)
MCV: 94 fL (ref 80–100)
MONO ABS: 0.4 x10 3/mm (ref 0.2–0.9)
Monocyte %: 7.9 %
NEUTROS PCT: 83.2 %
Neutrophil #: 3.7 10*3/uL (ref 1.4–6.5)
PLATELETS: 133 10*3/uL — AB (ref 150–440)
RBC: 3.26 10*6/uL — AB (ref 3.80–5.20)
RDW: 16.4 % — ABNORMAL HIGH (ref 11.5–14.5)
WBC: 4.5 10*3/uL (ref 3.6–11.0)

## 2014-07-12 LAB — BASIC METABOLIC PANEL
Anion Gap: 10 (ref 7–16)
BUN: 16 mg/dL
CALCIUM: 8.3 mg/dL — AB
CHLORIDE: 105 mmol/L
Co2: 25 mmol/L
Creatinine: 0.87 mg/dL
EGFR (African American): >60
EGFR (Non-African Amer.): >60
Glucose: 116 mg/dL — ABNORMAL HIGH
POTASSIUM: 4.3 mmol/L
Sodium: 140 mmol/L

## 2014-07-12 LAB — TROPONIN I: Troponin-I: 0.05 ng/mL — ABNORMAL HIGH

## 2014-07-13 ENCOUNTER — Emergency Department
Admit: 2014-07-13 | Disposition: A | Discharge status: Home / Self Care | Payer: Medicare Other | Source: Ambulatory Visit | Attending: Emergency Medicine | Admitting: Emergency Medicine

## 2014-07-13 LAB — CULTURE, BLOOD (SINGLE)

## 2014-07-14 NOTE — Consult Note (Signed)
PATIENT NAME:  Denise Duke, Denise Duke MR#:  [id number] DATE OF BIRTH:  12/20/1927  DATE OF CONSULTATION:  05/29/2014  REFERRING PHYSICIAN:   CONSULTING PHYSICIAN:  Marcina Millard, MD  PRIMARY CARE PHYSICIAN:  Dr. Gavin Potters.   CARDIOLOGIST:  Dr. Gwen Pounds.   CHIEF COMPLAINT: Chest pain.   REASON FOR CONSULTATION: Consultation requested for evaluation of chest pain.   HISTORY OF PRESENT ILLNESS: The patient is an 81 year old female with history of chronic diastolic congestive heart failure, COPD, hypertension, hyperlipidemia, and rheumatoid arthritis. The patient reports that she was in her usual state of health until last evening when she experienced left-sided chest discomfort, described as a pain, without nausea, vomiting, diaphoresis, or radiation. The patient was admitted to telemetry where troponin was borderline elevated at 0.04 with a followup of a normal troponin of 0.03. Today, the patient denies chest pain, reports doing well, and feels like her breathing is at baseline. She saw Dr. Philemon Kingdom nurse practitioner yesterday who increased her furosemide to 40 mg a day for peripheral edema.   PAST MEDICAL HISTORY:  1. Chronic diastolic congestive heart failure.  2. Hypertension.  3. COPD.   4. History of transient ischemic attack.  5. Interstitial lung disease.  6. History of asthma with status asthmaticus.  7. Obesity.  8. History of DVT.   9. Peripheral vascular disease.  10. Rheumatoid arthritis.   MEDICATIONS ON ADMISSION: Atorvastatin 80 mg at bedtime, carvedilol 3.125 mg b.i.d., clopidogrel 75 mg daily, furosemide 40 mg daily, lisinopril 2.5 mg daily, potassium 10 mEq daily, acetaminophen 1 q. 4 hours p.r.n., Advair Diskus 1 puff b.i.d., albuterol inhaler nebulizer solution 3 mL q. 6 p.r.n., Fosamax 70 mg weekly, calcium carbonate 1 tablet b.i.d., Celebrex 200 mg daily p.r.n., Klonopin 0.5 mg t.i.d. p.r.n., gabapentin 600 mg t.i.d., Combivent inhaler 1 puff q.i.d., Singulair 10 mg  daily, multivitamin 1 daily, omeprazole 20 mg daily, prednisone 5 mg b.i.d., Lyrica 25 mg t.i.d., Requip 0.5 mg at bedtime, tramadol 50 mg b.i.d., Desyrel 50 mg at bedtime.      SOCIAL HISTORY: The patient currently lives at assisted living at [place]. She denies tobacco abuse.   FAMILY HISTORY: Father with history of coronary artery disease.   REVIEW OF SYSTEMS:    CONSTITUTIONAL: No fever or chills.  EYES: No blurry vision.  EARS: No hearing loss.  RESPIRATORY: The patient has chronic exertional dyspnea due to underlying COPD. CARDIOVASCULAR: The patient had chest discomfort last night.  GASTROINTESTINAL: No nausea, vomiting, or diarrhea.  GENITOURINARY: No dysuria or hematuria.  ENDOCRINE: No polyuria or polydipsia.  MUSCULOSKELETAL: No arthralgias or myalgias.  NEUROLOGICAL: No focal muscle weakness or numbness.  PSYCHOLOGICAL: No depression or anxiety.   PHYSICAL EXAMINATION:  VITAL SIGNS: Blood pressure is 131/82, pulse 82, respirations 18, temperature 97.6, pulse oximetry 96%.  HEENT: Pupils equal, reactive to light and accommodation.  NECK: Supple without thyromegaly.  LUNGS: Reveal decreased breath sounds with scattered expiratory wheezes.  CARDIOVASCULAR: Normal JVP. Normal PMI. Regular rate and rhythm. Normal S1, S2. No appreciable gallop, murmur, or rub.  ABDOMEN: Soft and nontender. Pulses were intact bilaterally.  MUSCULOSKELETAL: Normal muscle tone.  NEUROLOGIC: The patient is alert and oriented x 3. Motor and sensory both grossly intact.   IMPRESSION: An 81 year old female with history of chronic diastolic congestive heart failure, chronic obstructive pulmonary disease on O2 therapy, who presents with chest pain with typical and atypical features, with unremarkable EKG and very trivial elevation in troponin which is normal today. The patient currently chest  pain-free and wishes to go home.   RECOMMENDATIONS:  1.  Agree with all of our current therapy.  2.  Add  isosorbide mononitrate 30 mg daily.  3.  Defer further cardiac diagnostics at this time.  4.  The patient has scheduled followup with Dr. Gwen Pounds next week.    ____________________________ Marcina Millard, MD ap:bu D: 05/29/2014 12:45:58 ET T: 05/29/2014 13:07:09 ET JOB#: [id number]  cc: Marcina Millard, MD, <Dictator> Marcina Millard MD ELECTRONICALLY SIGNED 06/11/2014 12:45

## 2014-07-14 NOTE — Discharge Summary (Signed)
Dates of Admission and Diagnosis:  Date of Admission 20-Jun-2014   Date of Discharge 25-Jun-2014   Admitting Diagnosis SOB   Final Diagnosis 1. Bilateral pneumonia 2. Acute on chronic resp failure 3. chronic obstructive pulmonary disease exacerbation 4. Septic shocl 5. acute renal failure due to ATN  6. Chronic diastolic chf 7. Malnutrition 8. Elevated troponin due to demenad ischemia    Chief Complaint/History of Present Illness CHIEF COMPLAINT: Diaphoresis, vomiting, and weakness.   HISTORY OF PRESENT ILLNESS: The patient is an 82 year old female with a known history of diastolic heart failure, COPD, hypertension, is being admitted for severe sepsis with septic shock. The patient had her birthday 2 days ago, was playing with her residents while at [facility], was quite normal yesterday.  Last night, she vomited and since then she has been more confused, has been having more difficulty walking. Now she is at the facility, called EMS and she was brought down to the Emergency Department.  While in the ED, she was found to be septic, had a temperature.  She had leukocytosis, tachycardia, hypertension and her chest x-ray showed pneumonia.  She was very confused and also was having difficulty breathing, was placed on BiPAP for same and is being admitted for further evaluation and management. Most of the information has been obtained from her family, who is at the bedside. As the patient is on BiPAP and unable to provide much history.  They deny any other recent illness while at [facility].   Allergies:  Aspirin: SOB  Levaquin: GI Distress, Other  Cephalexin: Other  Sulfa drugs: Unknown  Pertinent Past History:  Pertinent Past History PAST MEDICAL HISTORY:  1.  Diastolic heart failure.  2.  Hyperlipidemia.  3.  Gastrointestinal.   4.  Anxiety.  5.  COPD.    6.  Hypertension, 7.  History of rheumatoid arthritis.   Hospital Course:  Hospital Course 19 f with Diastolic chf,  hypertension here with SOB  * Bilateral pneumonia with acute on chronic resp failure and Septic shock IV abx. Possible aspiration pneumonia. IV steroids Cx NGTD Wean O2 as tolerated. Wears 2-3 L at home. Septic shock resolved.   * Acute chronic obstructive pulmonary disease exacerbation On IV steroids, Nebs, Inhalers  * Acute renal failure due to above - Resolved  * Anemia of Chronic Disease Stable  * Elevated troponins Due to demand ischemia  * Chronic diastolic chf Restarted Lasix.  Seen by Pulmonary and ID in hospital.  Time spent on discharge 40 min   Condition on Discharge Fair   PHYSICAL EXAM ON DISCHARGE:  Physical Exam:  GEN obese   NECK supple   RESP normal resp effort  wheezing   CARD regular rate   EXTR negative edema   PSYCH A+O to time, place, person   DISCHARGE INSTRUCTIONS HOME MEDS:  Medication Reconciliation: Patient's Home Medications at Discharge:     Medication Instructions  atorvastatin 80 mg oral tablet  1 tab(s) orally once a day (at bedtime)   celecoxib 200 mg oral capsule  1 cap(s) orally once a day, As Needed - for Pain   montelukast 10 mg oral tablet  1 tab(s) orally once a day (at bedtime)   gabapentin 600 mg oral tablet  1 tab(s) orally 3 times a day   lyrica 25 mg oral capsule  1 cap(s) orally 3 times a day   advair diskus 500 mcg-50 mcg inhalation powder  1 puff(s) inhaled 2 times a day   albuterol 2.5 mg/3  ml (0.083%) inhalation solution  3 milliliter(s) inhaled every 6 hours, As Needed - for Wheezing   tramadol 50 mg oral tablet  1 tab(s) orally 2 times a day, As Needed - for Pain   calcium 600+d  1 tab(s) orally 2 times a day   zostrix 0.025% topical cream  Apply topically to affected area 2 times a day, As Needed - for Pain   prednisone 5 mg oral tablet  1 tab(s) orally 2 times a day   tylenol 500 mg oral tablet  1 tab(s) orally 2 times a day, As Needed - for Pain   alendronate 70 mg oral tablet  1 tab(s) orally once a  week (on Monday)   pantoprazole 40 mg oral delayed release tablet  1 tab(s) orally once a day (in the morning)   isosorbide mononitrate 30 mg oral tablet, extended release  1 tab(s) orally once a day (in the morning)   leflunomide 20 mg oral tablet  0.5 tab(s) orally once a day (in the morning)   escitalopram 10 mg oral tablet  1 tab(s) orally once a day (in the morning)   carvedilol 3.125 mg oral tablet  1 tab(s) orally once a day (in the morning)   cranberry - oral capsule  1 cap(s) orally once a day (in the morning)   probiotic formula - oral capsule  1 cap(s) orally once a day (in the morning)   potassium chloride 10 meq oral tablet, extended release  1 tab(s) orally once a day (in the morning)   furosemide 20 mg oral tablet  1 tab(s) orally once a day (in the morning)   lisinopril 2.5 mg oral tablet  1 tab(s) orally once a day (in the morning)   clopidogrel 75 mg oral tablet  1 tab(s) orally once a day (in the morning)   combivent respimat cfc free 100 mcg-20 mcg/inh inhalation aerosol  1 puff(s) inhaled 4 times a day   clonazepam 0.5 mg oral tablet  1 tab(s) orally 3 times a day, As Needed - for Anxiety, Nervousness   prednisone 20 mg oral tablet  2 tab(s) orally once a day for 5 days and then 10 mg daily as before   amoxicillin-clavulanate  875 milligram(s) orally 2 times a day - 4 Days   loperamide 2 mg oral tablet  1 tab(s) orally 4 times a day, As Needed - for Diarrhea     Physician's Instructions:  Home Oxygen? Yes   Oxygen delivery at home: 2L  Nasal Cannula   Diet Low Sodium   Activity Limitations As tolerated   Return to Work Not Applicable   Time frame for Follow Up Appointment 1-2 weeks  primary care provider   Other Comments Prednisone 40 mg daily for 5 days and then resume previous dose 5mg  BID. Do not give 5mg  BID dose while on 40mg  dose.   Electronic Signatures: Keisean Skowron, (MD)  (Signed 12-Apr-16 14:37)  Authored: ADMISSION DATE AND DIAGNOSIS, CHIEF  COMPLAINT/HPI, Allergies, PERTINENT PAST HISTORY, HOSPITAL COURSE, PHYSICAL EXAM ON DISCHARGE, DISCHARGE INSTRUCTIONS HOME MEDS, PATIENT INSTRUCTIONS   Last Updated: 12-Apr-16 14:37 by (MD)

## 2014-07-14 NOTE — H&P (Signed)
PATIENT NAME:  Denise Duke, Denise Duke MR#:  [id number] DATE OF BIRTH:  08/22/1927  DATE OF ADMISSION:  05/29/2014   REFERRING PHYSICIAN:  Enedina Finner. Manson Passey, MD  PRIMARY CARE PHYSICIAN:  Letitia Caul, MD  CARDIOLOGIST:  Lamar Blinks, MD  CHIEF COMPLAINT:  Chest pain.  HISTORY OF PRESENT ILLNESS:  An 81 year old Caucasian female with a history of COPD, on 2 liters via nasal cannula at baseline; hyperlipidemia, unspecified; rheumatoid arthritis; and diastolic congestive heart failure, presenting with chest pain. She had acute onset of chest pain, which occurred at rest and retrosternal in location with radiation to the left arm, described only as "pain," intensity 8/10 without associated symptoms, no worsening or relieving factors, and currently resolved. She denies any further symptomatology.   REVIEW OF SYSTEMS: CONSTITUTIONAL:  Denies fevers, chills, fatigue, or weakness.  EYES:  Denies blurred vision, double vision, or eye pain.  EARS, NOSE, AND THROAT:  Denies tinnitus, ear pain, or hearing loss. RESPIRATORY:  Denies cough, wheeze, or shortness of breath.  CARDIOVASCULAR:  Positive for chest pain as described above. Denies orthopnea. Positive for edema.  GASTROINTESTINAL:  Denies nausea, vomiting, diarrhea, or abdominal pain.  GENITOURINARY:  Denies dysuria or hematuria.  ENDOCRINE:  Denies nocturia or thyroid problems.  Hematologic and lymphatic:  Denies easy bruising or bleeding. SKIN:  Denies rashes or lesions.  MUSCULOSKELETAL:  Denies pain in the neck, back, shoulders, knees, or hips or arthritic symptoms.  NEUROLOGIC:  Denies paralysis or paresthesias.  PSYCHIATRIC:  Denies anxiety or depressive symptoms.   Otherwise, full review of systems performed by me is negative.   PAST MEDICAL HISTORY:  Includes COPD; chronic respiratory failure on 2 to 3 liters via nasal cannula at baseline; diastolic congestive heart failure; CVA without residual deficit; gastroesophageal reflux  disease without esophagitis; hyperlipidemia, unspecified; rheumatoid arthritis.   SOCIAL HISTORY:  No alcohol, tobacco, or drug usage. Currently resides at [facility] facility.   FAMILY HISTORY:  Positive for coronary artery disease.   ALLERGIES:  ASPIRIN CAUSING SHORTNESS OF BREATH, CEPHALEXIN, LEVAQUIN, AND SULFA DRUGS.   HOME MEDICATIONS:  Include prednisone 5 mg p.o. b.i.d., celecoxib 200 mg p.o. daily as needed for pain, tramadol 50 mg p.o. b.i.d. as needed for pain, Tylenol 500 mg p.o. daily as needed for pain, lisinopril 2.5 mg p.o. daily, gabapentin 600 mg 1 tab p.o. 3 times daily, Klonopin 0.5 mg 1 tablet p.o. 3 times a day as needed for anxiety, Lyrica 25 mg p.o. 3 times daily, escitalopram 10 mg p.o. daily, atorvastatin 80 mg p.o. at bedtime, Plavix 75 mg daily, Coreg 3.125 mg p.o. daily, Fosamax 70 mg p.o. weekly on Monday, Advair 500/50 mcg inhalation 1 puff b.i.d., albuterol inhalation every 6 hours as needed for shortness of breath, Combivent 100/20 mcg inhalation 1 puff 4 times daily, Lasix 20 mg p.o. daily, leflunomide 20 mg 1/2 tablet p.o. daily, montelukast 10 mg p.o. at bedtime, potassium 10 mEq daily, probiotic daily, Prilosec 20 mg daily, calcium 600 plus D 1 tab p.o. b.i.d.   PHYSICAL EXAMINATION: VITAL SIGNS:  Temperature 97.5, heart rate 89, respirations 21, blood pressure 134/83, and saturating 97% on supplemental oxygen at 2 liters.  BMI 37.7.  GENERAL:  Chronically ill-appearing Caucasian female currently in no acute distress.  HEAD:  Normocephalic, atraumatic.  EYES:  Pupils are equal, round, and reactive to light. Extraocular muscles are intact. No scleral icterus.  MOUTH:  Moist mucosal membrane. Dentition is intact. No abscess noted.  EARS, NOSE, AND THROAT:  Clear without exudates. No external lesions.  NECK:  Supple. No thyromegaly. No nodules. No appreciable JVD.  PULMONARY:  Scant coarse rhonchi at the bases bilaterally. No wheezing. No use of  accessory muscles.  CHEST:  Nontender to palpation.  CARDIOVASCULAR:  S1 and S2, regular rate and rhythm. No murmurs, rubs, or gallops. Trace pedal edema bilaterally to the ankles. Pedal pulses are 2+ bilaterally.  GASTROINTESTINAL:  Soft, nontender, nondistended. No masses. Positive bowel sounds. No hepatosplenomegaly.  MUSCULOSKELETAL:  No swelling, clubbing, or edema. Range of motion is full in all extremities.  NEUROLOGIC:  Cranial nerves II through XII are intact. No gross focal neurological deficits. Sensation is intact. Reflexes are intact.  SKIN:  No ulcerations, lesions, rashes, or cyanosis. Skin is warm and dry. Turgor is intact.  PSYCHIATRIC:  Mood and affect are within normal limits. The patient is awake, alert, and oriented x 3. Insight and judgment are intact.   LABORATORY DATA:  EKG performed reveals right bundle branch block. No ST-T wave abnormalities.   Sodium is 137, potassium 3.9, chloride 101, bicarbonate 28, BUN 25, creatinine 0.89, glucose 156. Troponin is 0.04. WBC is 6.6, hemoglobin 10.9, and platelets are 156,000.   Chest x-ray performed, which reveals hypoexpanded lung volumes with stable bibasilar fibrotic changes without superimposed edema.   ASSESSMENT AND PLAN:  An 81 year old Caucasian female with a history of chronic obstructive pulmonary disease, chronic respiratory failure on 2 liters via nasal cannula at baseline, and diastolic congestive heart failure, presenting with chest pain.   1.  Chest pain, central. Admit to telemetry, observational status. Initiate statin therapy. Trend cardiac enzymes x 3. We will consult cardiology, as she follows with Dr. Gwen Pounds at [facility]. We will also continue her Plavix and beta blockade as well as ACE inhibitor.  2.  Diastolic congestive heart failure, mild exacerbation given lower extremity edema. Diuresis with IV Lasix. Follow urine output and renal function.  3. Chronic obstructive pulmonary disease on supplemental  oxygen at baseline, not in acute exacerbation. Continue with Combivent, Advair, and supplemental oxygen.  4.  Gastroesophageal reflux disease without esophagitis. Proton pump inhibitor therapy.  5.  Rheumatoid arthritis. Continue with prednisone.  6.  Venous thromboembolism prophylaxis with heparin subcutaneously.   CODE STATUS:  The patient is a full code.   TIME SPENT:  35 minutes.    ____________________________ Cletis Athens. Hower, MD dkh:nb D: 05/29/2014 01:46:40 ET T: 05/29/2014 02:26:56 ET JOB#: [id number]  cc: Cletis Athens. Hower, MD, <Dictator> DAVID Synetta Shadow MD ELECTRONICALLY SIGNED 06/05/2014 21:54

## 2014-07-14 NOTE — H&P (Signed)
PATIENT NAME:  Denise Duke, Denise Duke MR#:  [id number] DATE OF BIRTH:  December 29, 1927  DATE OF ADMISSION:  04/12/2014  PRIMARY DOCTOR:  Letitia Caul, MD.   CHIEF COMPLAINT: Shortness of breath.   HISTORY OF PRESENT ILLNESS: An 81 year old female patient with shortness of breath for a while, about a week, gotten worse this morning. The patient does have some cough and midsternal chest discomfort because of coughing. No fever. Does have extremity edema, but denies any orthopnea or PND.  The patient's chest pain is in the midsternal region, not radiating to left arm.  No nausea, no vomiting. Chest pain is mostly pleuritic.  The patient's shortness of breath is worse with coughing and she did have recent UI symptoms, tried nebulizers at home without any relief.    PAST MEDICAL HISTORY: COPD on home oxygen, hypertension, hyperlipidemia, rheumatoid arthritis, TIA, anxiety, and GERD.    PAST SURGICAL HISTORY: Hysterectomy, cholecystectomy, left knee surgery, hernia repair, bilateral foot surgery.   SOCIAL HISTORY: No smoking. No drinking. No drugs.   FAMILY HISTORY: Significant for MI in father.   ALLERGIES:  TO ASPIRIN, KEFLEX, LEVAQUIN, AND SULFA.   MEDICATIONS:  Advair Diskus 500/50 one puff b.i.d., albuterol nebulizers every 6 hours as needed, atorvastatin 80 mg p.o. daily, Coreg 3.125 mg p.o. daily, Celebrex 200 mg p.o. daily, Plavix 75 mg p.o. daily, Combivent 1 puff 4 times daily, cranberry capsule once a day, Fosamax 70 mg once a week, furosemide 20 mg p.o. daily, Klonopin 0.5 mg p.o. t.i.d., Neurontin 600 mg p.o. t.i.d., leflunomide 20 mg half tablet once a day, potassium chloride 10 me p.o. daily, prednisone 5 mg p.o. b.i.d., (20 mg p.o. daily, Singulair 10 mg p.o. daily, Lyrica 25 mg p.o. t.i.d., lisinopril 2.5 mg p.o. daily.    REVIEW OF SYSTEMS:  CONSTITUTIONAL: The patient has no fever.  Does have trouble breathing.  EYES: No blurred vision or double vision.  EARS, NOSE, AND THROAT: No  tinnitus.  No epistaxis.  No difficulty swallowing.  PULMONARY: The patient does have some cough and shortness of breath for a week.  GASTROINTESTINAL: No nausea. No vomiting. No abdominal pain.  CARDIOVASCULAR: No palpitations. Does have midsternal pleuritic chest pain.  SKIN: No skin rashes.  GENITOURINARY: No dysuria or hematuria.   HEMATOLOGIC:  No easy bruising.  ENDOCRINE: No polyuria or nocturia.   PHYSICAL EXAMINATION:  VITAL SIGNS: Temperature 98.9, heart rate 104, blood pressure is 157/91, saturations 96% on 2 liters.  GENERAL:  The patient is alert, awake, oriented, 81 year old female seen in the hospital, not in distress but does have audible wheezing.  HEAD: Atraumatic, normocephalic.  EYES: Pupils equal, reacting to light. Extraocular movements intact.  EARS, NOSE, AND THROAT: No tympanic membrane congestion. No turbinate hypertrophy. No  (oropharyngeal erythema.   NECK: Supple. No JVD. No carotid bruit. No  lymphadenopathy.  CARDIOVASCULAR: S1, S2 regular. The patient has no murmurs. Peripheral pulses are equal in   femoral,dorsalis pedis.  PULMONARY: Bilateral expiratory wheezes in all lung fields, the patient not using accessory muscles of respiration.  ABDOMEN: Soft, nontender, nondistended. Bowel sounds intact. No hernias.  EXTREMITIES: 1 + pitting edema present bilaterally at ankles.  SKIN:  No rashes. No jaundice.  NEUROLOGIC: Alert, awake, oriented. Cranial nerves II through XII intact. Power 5 out of 5 upper and lower extremities.  Sensory intact.  DTRs 2 + bilaterally.  PSYCHIATRIC: Mood and affect are within normal limits.   LABORATORY DATA: Chest x-ray shows no definite superimposed infiltrates or  edema, bilateral fibrosis present. CK total 95, CPK-MB 2.4. BNP 402. WBC 6.9, hemoglobin 12.3, hematocrit 37.(  platelets 152,000.  Electrolytes, sodium 137, potassium 3.8, chloride 102, bicarbonate 27, BUN 12, creatinine 0.9, and glucose 75. The t >patient's troponins of  0.13. EKG, normal sinus rhythm with no ST-T changes.  ASSESSMENT AND PLAN:  1.  The patient is an 81 year old female patient with history of chronic obstructive pulmonary disease on oxygen, comes in because of shortness of breath and wheezing, does have chronic obstructive pulmonary disease exacerbation. The patient is admitted to hospitalist service. We will continue oxygen, Solu-Medrol 40 q. 12 hours, along with vancomycin and Zithromax. Follow sputum cultures if the patient is able to produce.  2.  The patient does have pleuritic chest pain, elevated troponins, likely secondary to shortness of breath, but because of her initial troponins she is going to be seen by cardiology and we are going to monitor on telemetry. Continue her aspirin, beta blockers, and also ACE inhibitors and statins. The patient is on heparin drip, and follow the trend of the troponins and CK.  3.  History of severe rheumatoid arthritis. She is on prednisone and leflunomide, continue that.  4.  History of anxiety. She is on Klonopin, continue that.   TIME SPENT:  About 55 minutes.    ____________________________ Katha Hamming, MD sk:bu D: 04/12/2014 12:45:52 ET T: 04/12/2014 13:15:18 ET JOB#: [id number]  cc: Katha Hamming, MD, <Dictator> Letitia Caul, MD Dwayne D. Juliann Pares, MD Katha Hamming MD ELECTRONICALLY SIGNED 04/17/2014 15:25

## 2014-07-14 NOTE — Consult Note (Addendum)
PATIENT NAME:  Denise Duke, Denise Duke MR#:  [id number] DATE OF BIRTH:  [birth year]  DATE OF CONSULTATION:  06/21/2014  REFERRING PHYSICIAN:   CONSULTING PHYSICIAN:  Stann Mainland. Sampson Goon, MD  REFERRING PHYSICIAN: :Sherryll Burger  REASON FOR CONSULTATION: Pneumonia and sepsis.   HISTORY OF PRESENT ILLNESS: This is a very pleasant 81 year old female with history of rheumatoid arthritis on immunosuppressive, as well as COPD from secondhand smoke on home oxygen and inhalers, who was admitted April 7 with diaphoresis, vomiting, shortness of breath. She says she was in her usual state of health, which involves walking around and being very active. She then, 1-2 days prior to admission, had increasing difficulty with weakness. She vomited and had diarrhea. She then became short of breath. In the ER, she was found to have fever, leukocytosis, tachycardia, and the chest x-ray showed pneumonia. She was placed on BiPAP and has been in the intensive care unit since then. She has been covered with antibiotics and has clinically improved some.   PAST MEDICAL HISTORY: 1. Rheumatoid arthritis on immunosuppressive.  2. Hypertension.  3. COPD, follows with Dr. Meredeth Ide, on inhalers and oxygen at home.  4. Anxiety.  5. Hyperlipidemia.  6. Diastolic heart failure.   PAST SURGICAL HISTORY: Right foot surgery, left foot surgery, cholecystectomy, and partial hysterectomy.   SOCIAL HISTORY: Lives at [employer]. No alcohol, tobacco, or drugs. She said she had heavy secondhand tobacco exposure from her husband.   ALLERGIES: ASPIRIN, KEFLEX, LEVOFLOXACIN, AND SULFA.   REVIEW OF SYSTEMS: Eleven systems reviewed and negative, except as per HPI.   ANTIBIOTICS SINCE ADMISSION INCLUDE: Zosyn and azithromycin. Other medications were reviewed. She does remain on prednisone and is also on leflunomide.  PHYSICAL EXAMINATION: VITAL SIGNS: Temperature 98.7, pulse of 66, blood pressure 97/59, respirations 17, saturation 100% on  high flow oxygen.  GENERAL: She is cushingoid. She is in some respiratory distress, but is able to speak and sit up in the bed.  HEENT: Pupils are reactive. Sclerae anicteric. Oropharynx clear, with no thrush or lesions.  NECK: Supple.  HEART: Regular with 1/6 systolic murmur.  LUNGS:  Rhonchi throughout.  ABDOMEN: Soft, obese, cushingoid, nontender.  EXTREMITIES: No clubbing, cyanosis, or edema.  NEUROLOGIC: She is alert and oriented x 3. Grossly nonfocal neurologic exam. \ SKIN: No lesions, rash, or bruising.   LABORATORY DATA: White blood count, April 7, 20.0; April 8, it was 21.3; hemoglobin 10.5, platelets 174,000, neutrophil count was 20.1, lymphocytes 0.5. INR 1.4. Blood cultures x 2 no growth. Urine culture negative. Urinalysis: 0-5 white cells. Renal function shows a creatinine of 1.18, down from 2.06 on April 7; BUN 24. LFTs normal, except albumin low at 2.9.   IMAGING: Chest x-ray: Bilateral interstitial thickening, pleural effusion, and patchy alveolar airspace opacities with cardiomegaly. Differential considerations include pulmonary edema versus multifocal pneumonia.   IMPRESSION: A pleasant 81 year old female with rheumatoid arthritis on prednisone, as well as leflunomide, who presented with rather sudden onset of nausea, vomiting, shortness of breath, fevers, leukocytosis. She does have chronic obstructive pulmonary disease and is on home oxygen and inhalers.   I suspect she did aspirate, given the vomiting prior to onset. She does remain somewhat critically ill.   RECOMMENDATIONS:  1. I would recommend continuing Zosyn and azithromycin at this time; however, given that she is on chronic prednisone, she would have some risk for an atypical infection, such as PCP. If she worsens and a bronchoscopy could be done, I would suggest a BAL.  2. If she  continues to improve with the current regimen, I would suggest she can be transitioned to Augmentin to finish a 10-day course from  admission, if she continues to improve.  3. Thank you for the consultation. I will be  glad to follow with you     ____________________________ Stann Mainland. Sampson Goon, MD dpf:mw D: 06/21/2014 14:58:05 ET T: 06/21/2014 15:18:44 ET JOB#: [id number]  cc: Stann Mainland. Sampson Goon, MD, <Dictator> DAVID Sampson Goon MD ELECTRONICALLY SIGNED 07/16/2014 10:16

## 2014-07-14 NOTE — Consult Note (Signed)
Present Illness The patient is an 81 year old female with history of COPD, hyperlipidemia, hypertension and history of diastolic heart failure who was admitted after presenting to the emergency room with complaints of severe shortness of breath and fatigue.  She was having bronchospasm.  Her initial cardiac troponin was  0.13.  She had some chest pain which was pleuritic in nature.  Electrocardiogram reveals sinus tachycardia with no ischemia.  She was placed on IV heparin in given Solu-Medrol and bronchodilators.  Chest x-ray revealed no airspace disease or pulmonary edema.  She reports compliance with her medications.  This included albuterol, atorvastatin at 80 mg daily, carvedilol at 3.125 mg twice daily, clopidogrel, lisinopril at 2.5 mg daily.  She feels better since admission.  She is able to speak in more complete sentences.   Physical Exam:  GEN well nourished   HEENT PERRL   NECK No masses   RESP postive use of accessory muscles  wheezing  rhonchi   CARD Regular rate and rhythm  Normal, S1, S2  Murmur   Murmur Systolic   Systolic Murmur axilla   ABD denies tenderness   LYMPH negative neck   EXTR negative cyanosis/clubbing, positive edema   SKIN normal to palpation   NEURO cranial nerves intact, motor/sensory function intact   PSYCH A+O to time, place, person   Review of Systems:  Subjective/Chief Complaint Shortness of breath and fatigue with pleuritic chest pain   General: No Complaints   Skin: No Complaints   ENT: No Complaints   Eyes: No Complaints   Neck: No Complaints   Respiratory: Frequent cough  Short of breath  Wheezing   Cardiovascular: Chest pain or discomfort  Dyspnea   Gastrointestinal: No Complaints   Genitourinary: No Complaints   Vascular: No Complaints   Musculoskeletal: No Complaints   Neurologic: No Complaints   Hematologic: No Complaints   Endocrine: No Complaints   Psychiatric: No Complaints   Review of Systems: All  other systems were reviewed and found to be negative   Medications/Allergies Reviewed Medications/Allergies reviewed   Family & Social History:  Family and Social History:  Family History Non-Contributory   Social History negative tobacco   Place of Living Home   EKG:  EKG NSR   Abnormal NSSTTW changes   Interpretation nonspecific ST T wave changes    Aspirin: SOB  Levaquin: GI Distress, Other  Cephalexin: Other  Sulfa drugs: Unknown   Impression 81 year old female with history of COPD, hypertension hyperlipidemia admitted with a probable COPD flare.  There is no evidence of pulmonary edema.  Mildly elevated serum troponin appears to be secondary to demand ischemia.  Her chest pain appears to be pleuritic.  EKG does not suggest an acute ischemic event.  Would continue with heparin IV until 2nd troponin as returned.  If this is less than 1 would discontinue heparin and continue to treat with aggressive bronchodilators and empiric antibiotics.  Will continue with atorvastatin, carvedilol, clopidogrel, lisinopril and follow.   Plan 1. Continue with the atorvastatin, carvedilol, clopidogrel, lisinopril 2. Aggressive treated with bronchodilators 3. Will discontinue heparin after 2nd troponin is return 4. low-sodium diet 5. Continue with empiric antibiotics 6. Will follow with you.   does not appear to be having acute coronary event.   Electronic Signatures: Dalia Heading (MD)  (Signed 29-Jan-16 14:07)  Authored: General Aspect/Present Illness, History and Physical Exam, Review of System, Family & Social History, EKG , Allergies, Impression/Plan   Last Updated: 29-Jan-16 14:07 by Harold Hedge  A (MD)

## 2014-07-14 NOTE — Discharge Summary (Addendum)
PATIENT NAME:  Denise Duke, Denise Duke MR#:  [id number] DATE OF BIRTH:  Mar 15, 1928  DATE OF ADMISSION:  07/11/2014 DATE OF DISCHARGE:  07/12/2014  ADMISSION DIAGNOSES:  1. Altered mental status/lethargy.  2. Systemic inflammatory response syndrome.   DISCHARGE DIAGNOSES: 1. Altered mental status with lethargy and generalized weakness.  2. History of chronic obstructive pulmonary disease.  3. History of diastolic heart failure.  4. Hyperlipidemia.  5. Depression.  6. Neuropathy.  7. Hypertension.  8. Elevated troponin.  CONSULTATIONS: None.   PHYSICAL EXAMINATION AT DISCHARGE: VITAL SIGNS:  Temperature 98.4, pulse 94, respirations 21, blood pressure 125/67, saturation 93% on 4 liters.  GENERAL: The patient is alert and oriented, denied acute distress.  LUNGS: Clear to auscultation. There is no wheezing, rales, rhonchi.  CARDIOVASCULAR: Two out of 6 systolic ejection heard best at the right sternal border. Regular rate and rhythm.  ABDOMEN: Bowel sounds are positive, nontender, nondistended.   LABORATORY DATA AT DISCHARGE:  Troponin 0.05, white blood cells 4.5, hemoglobin 9.8, hematocrit 31, platelets are 133,000, sodium 140, potassium 4.3, chloride is 105, bicarbonate 25, BUN 16, creatinine 0.87 glucose is 116.   IMAGING:   1. CT of the head showed no acute intracranial hemorrhage or CVA.  2. Blood culture negative to date.   HOSPITAL COURSE: A 81 year old female who presented on 07/11/2014  from assisted living due to weakness and lethargy.  For further details, please refer to the H and P.  1. Altered mental status with lethargy: The patient apparently came in with altered mental status and lethargy.  The patient is completely alert and oriented. Currently she had no evidence of hypercarbia or hypoxemia. She is on 4 liters of oxygen. A CT of the head showed no acute abnormality. She says that she was just very weak which is generalized. Physical therapy did recommend home with home health.   2. SIRS: The patient had a low grade temperature on admission and slightly tachycardic but there is no evidence of infection.  3. COPD:   No acute exacerbation. The patient will continue outpatient medications. 4. History of diastolic heart failure. The patient is not in congestive heart failure. She is on  Lasix.  Due to low blood pressure, her ACE inhibitor, beta blocker were held.  Her blood pressure has improved.  5. Hyperlipidemia on atorvastatin.  The patient will continue this.   DISCHARGE MEDICATIONS: 1. Atorvastatin 80 mg at bedtime.  2. Celebrex 200 mg daily.  3. Montelukast 10 mg daily.  4. Gabapentin 600 mg t.i.d.  5. Lyrica 25 mg t.i.d.  6. Advair Diskus 500/50 b.i.d.  7. Albuterol 3 mL every 6 hours p.r.n.  8. Tramadol 50 mg b.i.d. p.r.n.  9. Calcium plus D 1 tablet b.i.d.  10. Zostrix topical b.i.d. p.r.n.  11. Prednisone 5 mg b.i.d.  12. Tylenol 500 mg b.i.d. p.r.n.  13. Alendronate 70 mg weekly on Tuesday.  14. Pantoprazole 40 mg at bedtime.  15. Leflunomide 20 mg 1/2 tablet in the a.m.  16. Citalopram 10 mg daily.  17. Coreg 3.125 b.i.d.  18. Cranberry 1 capsule daily.  19. Probiotic 1 capsule daily.  20. Lasix 20 mg daily.  21. Plavix 75 mg daily.  22. Combivent 1 puff 4 times a day.  23. Clonazepam 0.5 mg t.i.d. p.r.n. anxiety.   The patient will hold Imdur, potassium, and lisinopril until further evaluation for her blood pressure. Discharge home health, physical therapy and nurse to monitor blood pressure.   DISCHARGE OXYGEN: Four liters.  DISCHARGE DIET: Low sodium.   DISCHARGE ACTIVITY: As tolerated.    DISCHARGE INSTRUCTIONS: Monitor blood pressure daily.  May restart blood pressure medications once blood pressure is greater than 130/80.   TIME SPENT: 35 minutes.  The patient was stable for discharge.    ____________________________ Zakhi Dupre P. Juliene Pina, MD spm:tr D: 07/12/2014 12:32:18 ET T: 07/12/2014 12:57:28 ET JOB#: [id number]  cc: Jonathan Kirkendoll P.  Juliene Pina, MD, <Dictator> Letitia Caul, MD Janyth Contes Kasiah Manka MD ELECTRONICALLY SIGNED 07/12/2014 21:01

## 2014-07-14 NOTE — H&P (Signed)
PATIENT NAME:  Denise Duke, Denise Duke MR#:  [id number] DATE OF BIRTH:  [birth year]  DATE OF ADMISSION:  06/20/2014  PRIMARY CARE PHYSICIAN:  Rolm Gala, MD at [facility].   REQUESTING PHYSICIAN: Daryel November, MD   CHIEF COMPLAINT: Diaphoresis, vomiting, and weakness.   HISTORY OF PRESENT ILLNESS: The patient is an 81 year old female with a known history of diastolic heart failure, COPD, hypertension, is being admitted for severe sepsis with septic shock. The patient had her birthday 2 days ago, was playing with her residents while at [facility], was quite normal yesterday.  Last night, she vomited and since then she has been more confused, has been having more difficulty walking. Now she is at the facility, called EMS and she was brought down to the Emergency Department.  While in the ED, she was found to be septic, had a temperature.  She had leukocytosis, tachycardia, hypertension and her chest x-ray showed pneumonia.  She was very confused and also was having difficulty breathing, was placed on BiPAP for same and is being admitted for further evaluation and management. Most of the information has been obtained from her family, who is at the bedside. As the patient is on BiPAP and unable to provide much history.  They deny any other recent illness while at [facility].     PAST MEDICAL HISTORY:  1.  Diastolic heart failure.  2.  Hyperlipidemia.  3.  Gastrointestinal.   4.  Anxiety.  5.  COPD.    6.  Hypertension, 7.  History of rheumatoid arthritis.     PAST SURGICAL HISTORY:  1.  Right foot surgery. 2.  Left foot surgery.    3.  Left knee surgery. 4.  Cholecystectomy.   5.  Partial hysterectomy.   ALLERGIES: ASPIRIN, KEFLEX, LEVAQUIN AND SULFA.   SOCIAL HISTORY: The patient lives at [facility]. No alcohol, no smoking. No evidence of abuse.    MEDICATIONS:  At the facility are as follows:  1.  Advair 5/50 at 1 puff b.i.d.  2.  Albuterol inhalation  every 6 hours as needed.  3.  Alendronate 20 mg p.o. once a week on Monday. 4.  Atorvastatin 80 mg p.o. at bedtime. 5.  Calcium with vitamin D 1 tablet twice a day.  6.  Coreg 3.125 mg p.o. daily.  7.  Celecoxib 200 mg p.o. daily as needed.  8.  Clonazepam 0.5 mg p.o. 3 times a day as needed.  9.  Plavix 75 mg p.o. daily. 10. Combivent 1 puff inhaled 4 times a day.  11. Cranberry capsule, 1 capsule p.o. daily.   12. Lexapro 10 mg p.o. daily.  13. Lasix 20 mg p.o. at daily. 14. Gabapentin 600 mg p.o. 3 times a day.  15. Isosorbide mononitrate 30 mg p.o. daily. 16. Leflunomide 20 mg one-half tablet p.o. daily.   17. Lisinopril 2.5 mg p.o. daily.  18. Lyrica 25 mg p.o. 3 times a day.   19. Singulair 10 mg p.o. at bedtime.  20. Protonix 40 mg p.o. daily. 21. Potassium chloride 10 mEq p.o. daily.  22. Prednisone 5 mg p.o. b.i.d.  23. Probiotic once daily.  24. Tramadol 50 mg p.o. b.i.d. as needed.  25. Tylenol 500 mg p.o. b.i.d. a day as needed.  25.  Zostrix 0.025% topical cream to affected area as needed.    REVIEW OF SYSTEMS:  Unable to obtain as the patient is lethargic, has BiPAP on.   PHYSICAL EXAMINATION:  VITAL SIGNS: Temperature 97.8, heart rate  85 per minute, respirations 20 per minute, blood pressure 71/46. She is saturating 94% on BiPAP.  GENERAL: The patient is an 81 year old female lying in the bed in acute respiratory distress. She had BIPAP applied while in the ED.    EYES:  Pupils are equal round and reactive to light and accommodation. No scleral icterus. HEENT: Head atraumatic, normocephalic. Oropharynx and nasopharynx dry and clear. She has a BiPAP blood on her face. NECK: Supple. No jugular venous distention. No thyroid enlargement or tenderness.  LUNGS:  Clear to auscultation, decreased breath sounds at the bases, rhonchi at the right lung base.  CARDIOVASCULAR: S1 S2. Normal, tachycardic. No murmurs.  ABDOMEN: Soft, nontender, nondistended. Bowel sounds present,  no organomegaly or masses.   EXTREMITIES: No pedal edema, cyanosis, clubbing.  NEUROLOGIC: Difficult to evaluate. She is somewhat lethargic and obtunded, seems nonfocal. Sensation intact.  GAIT:  Not checked. PSYCHIATRY: Unable to assess as she is quite lethargic, otherwise judgment intact.   SKIN: No obvious rash or lesion.  MUSCULOSKELETAL: No joint swelling or effusion.  LABORATORY DATA:  Normal liver function tests. Normal BMP, except BUN of 30, creatinine 2.06, blood sugar of 111.  BNP of 862, troponin of 0.62, MB fraction 11.0. CBC showed white count of 20.8, hemoglobin 11.1, hematocrit 35.8, platelets 160.  Urinalysis within normal limits. ABG showed pH of 7.39, pCO2 of 46, PO2 of 156, bicarbonate of 27.8 while on BiPAP.   Chest x-ray in the ED showed new right upper lobe infiltrate versus pneumonia.   IMPRESSION AND PLAN:   1.  Severe sepsis with septic shock. Start sepsis protocol.  We will consult intensivist, palliative care.  aggresive IV hydration, start her on pressors as needed, provide broad-spectrum antibiotics. We will check Clostridium difficile if she any diarrhea.  Monitor in Intensive Care Unit. 2.  Pneumonia. We will start IV meropenem, Zithromax.  Consult with infectious diseases.  Obtain blood and sputum culture.  3.  Acute renal failure, likely due to acute tubular necrosis from sepsis. Avoid nephrotoxins, consider Foley if she cannot urinate.  Aggressive IV hydration for now.  4.  Elevated troponin, likely due to supply demand  ischemia from sepsis, rule out myocardial infarction. We will rule out with serial troponins.   CODE STATUS: DO NOT RESUSCITATE.   We will consult palliative care.  She has very poor prognosis.    TOTAL TIME TAKING CARE OF THIS PATIENT:   45 minutes (critical care, she is a very sick and high risk for acute cardiorespiratory failure and multiorgan failure).    ___________________________ Ellamae Sia. Sherryll Burger, MD vss:DT D: 06/20/2014 15:25:05  ET T: 06/20/2014 15:56:08 ET JOB#: [id number]  cc: Lynee Rosenbach S. Sherryll Burger, MD, <Dictator> Letitia Caul, MD  Ellamae Sia [facility] MD ELECTRONICALLY SIGNED 06/23/2014 13:24

## 2014-07-14 NOTE — H&P (Signed)
PATIENT NAME:  Denise Duke, Denise Duke MR#:  [id number] DATE OF BIRTH:  [birth year]  DATE OF ADMISSION:  07/11/2014  PRIMARY CARE PHYSICIAN: Rolm Gala, MD.   CHIEF COMPLAINT: Weakness, lethargy, altered mental status,   HISTORY OF PRESENT ILLNESS: This is an 81 year old female, who presents to the hospital from assisted living due to weakness, lethargy, and altered mental status. At baseline, the patient is able to ambulate with the help of a walker, but today, the patient could not put any weight on her knees and ambulate and therefore was sent to the ER for further evaluation. In the emergency room, the patient was noted to be tachycardic and also thought to have possible low-grade fever of 99.4, although the patient is infectious work-up and has been negative. She remains a lethargic, somnolent and profoundly weak and therefore hospitalist services were contacted for further treatment and evaluation. The patient denies any chest pain, any shortness of breath, any nausea, vomiting, abdominal pain, any cough, congestion, diarrhea, or any other associated symptoms presently.   REVIEW OF SYSTEMS:  CONSTITUTIONAL: No documented fever. No weight gain or weight loss.  EYES: No blurred or double vision.  EARS, NOSE AND THROAT: No tinnitus. No postnasal drip. No redness of the oropharynx.  RESPIRATORY: No cough, no wheeze, no hemoptysis, no dyspnea.  CARDIOVASCULAR: No chest pain, no orthopnea, or palpitations. No syncope.  GASTROINTESTINAL: No nausea, no vomiting, diarrhea. No abdominal pain. No melena or hematochezia.  GENITOURINARY: No dysuria or hematuria.  ENDOCRINE: No polyuria or nocturia. No heat or cold intolerance.  HEMATOLOGIC: No anemia, no bruising, no bleeding.  INTEGUMENTARY: No rashes. No lesions.  MUSCULOSKELETAL: No arthritis, no swelling, no gout.  NEUROLOGIC: No numbness or tingling. No ataxia. No seizure-type activity.  PSYCHIATRIC: Positive depression. No anxiety. No ADD.   PAST  MEDICAL HISTORY: Consistent with chronic obstructive pulmonary disease on chronic O2, history of diastolic congestive heart failure, hypertension, hyperlipidemia, neuropathy, depression.   History of recent pneumonia.   ALLERGIES: ASPIRIN, KEFLEX, LEVAQUIN AND SULFA DRUGS.   SOCIAL HISTORY: No smoking. No alcohol abuse. No illicit drug abuse. Resides at assisted living.   FAMILY HISTORY: Mother and father are both deceased. Mother died from a stomach cancer. Father died from a myocardial infarction.   CURRENT MEDICATIONS: As follows: Advair 500/50 one puff b.i.d., albuterol nebulizer every 6 hours as needed, Fosamax 70 mg weekly, atorvastatin 80 mg daily, calcium vitamin D 1 tab b.i.d., Coreg 3.125 mg b.i.d., celecoxib 200 mg daily as needed, clonazepam 0.5 mg t.i.d. as needed, Plavix 75 mg daily, Combivent respimat 1 puff q.i.d., cranberry supplement daily, Lexapro 10 mg daily, Lasix 20 mg daily, gabapentin 600 mg t.i.d., Imdur 30 mg daily, leflunomide 20 mg 1/2 tablet daily, lisinopril 2.5 mg daily, loperamide 2 mg q.i.d. as needed, Lyrica 25 mg t.i.d., Singulair 10 mg daily, Protonix 40 mg daily, potassium 10 mEq daily. Prednisone 5 mg 1 tab b.i.d., probiotic 1 tablet daily, tramadol 50 mg b.i.d. as needed, Tylenol 500 mg b.i.d. as needed Zostrix 0.025 topical cream to be applied to the affected area b.i.d.   PHYSICAL EXAMINATION: Presently is as follows:  VITAL SIGNS: Temperature 99, pulse 92, respirations 22, blood pressure 91/54, sats 97% on 3 liters nasal cannula.  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  HEAD, EYES, EARS, NOSE, AND THROAT: Atraumatic, normocephalic. Her extraocular muscles are intact. Pupils are equal and reactive to light. Sclerae anicteric. No conjunctival injection. No pharyngeal erythema.  NECK: Supple. There  is no jugular venous distention or bruits, no lymphadenopathy, no thyromegaly.  HEART: Regular rate and rhythm. No murmurs, no  rubs, no clicks.  LUNGS: She has some and expiratory wheezing. Negative use of accessory muscles, dullness to percussion. ABDOMEN: Soft, flat, nontender, nondistended. Has good bowel sounds. No hepatosplenomegaly appreciated.  EXTREMITIES: No evidence of any cyanosis, clubbing, or peripheral edema. Has +2 pedal and radial pulses bilaterally.  NEUROLOGICAL: The patient is alert, awake, and oriented x3 with no focal motor or sensory deficits appreciated bilaterally.  SKIN: Moist and warm with no rashes.  LYMPHATIC: There is no cervical or axillary adenopathy.   LABORATORY DATA: Serum glucose of 119, BUN 16, creatinine 1.05, sodium 136, potassium 3.9, chloride 100, bicarbonate 30. The patient's LFTs are within normal limits. Troponin 0.09 white cell count 9, hemoglobin 11.6, hematocrit 35.7, platelet count 169,000. INR is 1.1. Urinalysis within normal limits. The patient did have a chest x-ray done, which showed marked improvement in the bibasilar airspace disease since recent exam, a CT head showing no acute intracranial abnormality with chronic right frontal encephalomalacia.   ASSESSMENT AND PLAN: This 81 year old female with a history of chronic obstructive pulmonary disease, history of diastolic congestive heart failure hypertension, hyperlipidemia, neuropathy, depression, who presents to the hospital with weakness lethargy and difficulty walking.   1. Altered mental status/lethargy. The exact etiology of this is unclear. I suspect this is probably metabolic encephalopathy. May be from hypercarbia. We will get an ABG to rule that out. Her infectious workup is negative with a negative urinalysis and a negative chest x-ray. A CT head also shows no evidence of any acute abnormality. I will get a physical therapy consult to assess her mobility. Observe her overnight. Follow every 4 hour neuro checks.  2. Systemic inflammatory response syndrome. The patient does have a low-grade fever of 99.4, is  tachycardic, although the source is unclear. The patient did get one dose of intravenous vancomycin and Zosyn in the emergency room, but I do not have a clear source, therefore, I will hold any further antibiotics at this time.  3. Chronic obstructive pulmonary disease. The patient had no acute chronic obstructive pulmonary disease exacerbation. I will continue her Advair and Combivent.  4. History of diastolic congestive heart failure. Clinically, the patient is not in congestive heart failure. I will continue her Lasix, hold ACE inhibitor and beta blocker, given some relative hypotension.  5. Hyperlipidemia. Continue atorvastatin.  6. Depression. Continue Lexapro.  7. Neuropathy. Continue Neurontin and Lyrica.  8. Hypertension. I will hold all antihypertensives, given her relative hypotension.   CODE STATUS: The patient is a full code.   TIME SPENT ON ADMISSION: 50 minutes   ____________________________ Rolly Pancake. Cherlynn Kaiser, MD vjs:AT D: 07/11/2014 15:18:38 ET T: 07/11/2014 15:33:46 ET JOB#: [id number]  cc: Rolly Pancake. Cherlynn Kaiser, MD, <Dictator> Houston Siren MD ELECTRONICALLY SIGNED 07/12/2014 14:43

## 2014-07-14 NOTE — Discharge Summary (Signed)
PATIENT NAME:  Denise Duke, Denise Duke MR#:  [id number] DATE OF BIRTH:  October 17, 1927  DATE OF ADMISSION:  05/29/2014 DATE OF DISCHARGE:  05/29/2014  DISCHARGE DIAGNOSES:  1.  Acute on chronic diastolic congestive heart failure.  2.  Gastroesophageal reflux disease.   3.  Chest pain.  4.  Rheumatoid arthritis.  5.  Acute bronchitis.   DISCHARGE MEDICATIONS: Please refer to detailed medication reconciliation.   IMAGING STUDIES: Chest x-ray, which showed pulmonary edema.   CONSULTANTS: Dr. Darrold Junker with cardiology.   ADMITTING HISTORY AND PHYSICAL AND BRIEF HOSPITAL COURSE: Please see detailed H and P dictated by Dr. Clint Guy. In brief, an 81 year old Caucasian female patient with history of COPD on 2 L home oxygen, hyperlipidemia, rheumatoid arthritis, diastolic CHF, presented to the hospital complaining of some chest pain. The patient was found to have acute and chronic diastolic CHF along with a mildly elevated troponin, admitted to hospitalist service.   HOSPITAL COURSE: The patient was admitted on telemetry floor, started on IV Lasix with which she diuresed well, shortness of breath resolved. Her chest pain resolved. She returned back to normal. Was seen by Dr. Darrold Junker with minimal elevation of troponin, but this was thought to be secondary to CHF and demand ischemia, no further other cardiac diagnostics were advised. She was started by Dr. Darrold Junker on Imdur 30 mg for which she has been given a prescription at time of discharge. The patient also had some mild acute bronchitis for which she will be on erythromycin for 5 days after discharge per.   Prior to discharge lungs sound clear. S1, S2 heard. No edema.   DISCHARGE INSTRUCTIONS: Low-salt diet. Activity as tolerated. Follow up with primary care physician and cardiologist in 1-2 weeks.   TIME SPENT ON DAY OF DISCHARGE IN DISCHARGE ACTIVITY: 40 minutes.    ____________________________ Molinda Bailiff Alayziah Tangeman, MD srs:bm D: 06/01/2014 18:20:55  ET T: 06/02/2014 01:47:10 ET JOB#: [id number]  cc: Wardell Heath R. Aryianna Earwood, MD, <Dictator> Orie Fisherman MD ELECTRONICALLY SIGNED 06/04/2014 2:23

## 2014-07-14 NOTE — Discharge Summary (Signed)
PATIENT NAME:  Denise Duke, STRIDER MR#:  [id number] DATE OF BIRTH:  11/24/1927  DATE OF ADMISSION:  04/12/2014 DATE OF DISCHARGE:  04/15/2014  ADMITTING COMPLAINT: Shortness of breath.   DISCHARGE DIAGNOSES: 1.  Chronic obstructive pulmonary disease exacerbation.  2.  Elevated troponins due to demand ischemia.  3.  Deconditioning.  4.  Diarrhea due to irritable bowel syndrome.  5.  Chronic respiratory failure, on 2 to 3 liters of home oxygen.  6.  Hypertension.  7.  Hyperlipidemia.  8.  Rheumatoid arthritis.  9.  Anxiety.  10.  Gastroesophageal reflux disease.  11.  History of transient ischemic attack.   DISCHARGE MEDICATIONS: 1.  Atorvastatin 80 mg 1 tablet daily.  2.  Celecoxib 20 mg 1 tablet once a day as needed for pain.  3.  Omeprazole 20 mg 1 tablet daily.  4.  Clopidogrel 75 mg 1 tablet daily.  5.  Montelukast 10 mg 1 tablet daily.  6.  Lisinopril 2.5 mg 1 tablet once a day.  7.  Furosemide 20 mg 1 tablet daily.  8.  Gabapentin 600 mg 1 tablet 3 times a day.  9.  Klonopin 0.5 mg 1 tablet 3 times a day as needed for anxiety.  10.  Lyrica 25 mg 1 capsule 3 times a day.  11.  Potassium chloride 10 mEq extended release 1 capsule once a day.  12.  Probiotic formula 1 capsule once a day.  13.  Cranberry oral capsule 1 capsule once a day.  14.  Advair Diskus 500 mcg/50 mcg 1 puff inhaled twice a day.  15.  Carvedilol 3.125 mg 1 tablet twice a day.  16.  Combivent CFC free 100 mcg/20 mcg 1 puff inhaled 4 times a day.  17.  Albuterol 2.5 mg/3 mL 0.83% inhalation solution 3 mL inhaled every 6 hours as needed for wheezing or shortness of breath.  18.  Escitalopram 10 mg 1 tablet once a day.  19.  Tramadol 50 mg 1 tablet twice a day as needed for pain.  20.  Leflunomide 20 mg 1/2 tablet once a day.  21.  Fosamax 70 mg 1 tablet once a week on Mondays.  22.  Calcium 600 mg and vitamin D 1 tablet twice a day.  23.  Zostrix 0.025% topical cream applied to effected area twice a day as  needed for pain.  24.  Tylenol 500 mg 1 tablet once a day as needed for pain.  25.  Prednisone 50 mg 1 tablet once a day for the next 3 days, stop on February 4th and at that time resume prednisone 10 mg daily.  26.  Prednisone 10 mg daily, chronic prescription.  27.  Azithromycin 500 mg 1 tablet once a day for the next 3 days, stop date February 4th.  CONSULTATIONS: Harold Hedge, cardiology.   DIAGNOSTIC DATA: Chest x-ray, January 29th, shows no definite superimposed infiltrate or edema, bilateral fibrotic changes.   HISTORY OF PRESENT ILLNESS: This very pleasant 81 year old woman with past medical history of COPD, on 2 liters of oxygen at home, presents with shortness of breath which had been getting worse for about a week. The patient was admitted for chronic obstructive pulmonary disease exacerbation.   HOSPITAL COURSE BY PROBLEM:  1.  Acute exacerbation of COPD: The patient was admitted to the regular floor and started on IV Solu-Medrol 40 mg every 12 hours along with vancomycin and Zithromax. She was not able to produce sputum for culture. Chest x-ray did not indicate  pneumonia. She had no leukocytosis or fevers. This was most likely a COPD exacerbation not associated with any type of infection. She was transitioned to oral prednisone for the 24 hours prior to discharge. At the time of discharge, she is breathing easily on 3 liters nasal cannula keeping oxygen saturations in the 90s. She will also complete a 5 day course of azithromycin after discharge.  2.  Elevated troponins: The patient was seen by cardiology inpatient. She had no chest pain, syncope, edema, or other symptoms of acute coronary syndrome. She was initially started on a heparin drip, which was stopped after 24 hours. Troponins did trend downward. She continues on aspirin, Plavix, ACE inhibitor and a statin.  3.  Deconditioning: The patient lives in an assisted living facility. She was seen by physical therapy during this  admission and home health physical therapy was advised. This will be continued at the assisted living facility.  4.  Rheumatoid arthritis: She is on prednisone 10 mg daily, which she will resume after this higher dose for treatment of COPD exacerbation. She continues on leflunomide. There was no sign of rheumatoid arthritis flare during this admission.  5.  Anxiety: She continues on her standard regimen for anxiety, which includes Klonopin and escitalopram.   DISCHARGE PHYSICAL EXAMINATION: VITAL SIGNS: Temperature 98.4, pulse 87 initially and 118 after exertion, respirations 20, blood pressure 133/83, oxygenation 95% on 3 liters nasal cannula.  GENERAL: No acute distress.  RESPIRATORY: The patient does take rapid and shallow respirations. Lungs are clear to auscultation bilaterally with good air movement.  CARDIOVASCULAR: Regular rate and rhythm. No murmurs, rubs, or gallops. No peripheral edema. Peripheral pulses 2+.  ABDOMEN: Soft, nontender, nondistended. Bowel sounds are normal.  PSYCHIATRIC: The patient is alert and oriented. She has fairly good insight into her clinical condition.   LABORATORY DATA: On discharge, sodium is 139, potassium 4.2, chloride 101, bicarb 33, BUN 17, creatinine 0.77, glucose 79.   Troponin was initially 0.13, trended down to 0.09.   White blood cells 6.5, hemoglobin 11.3, platelets 126,000, MCV 93.   CONDITION ON DISCHARGE: Fair.  DISPOSITION: Discharge back to assisted living facility with home health physical therapy.   DISCHARGE INSTRUCTIONS: DISCHARGE DIET: Heart healthy, low sodium, low cholesterol diet.   DISCHARGE ACTIVITY: No restrictions per physical therapy recommendations.  DISCHARGE FOLLOWUP: The patient should follow up with her primary care physician within 2 weeks of discharge. This appointment will be made before discharging.   TIME SPENT ON DISCHARGE: 45 minutes.  ____________________________ Ena Dawley. Clent Ridges,  MD cpw:sb D: 04/15/2014 12:13:01 ET T: 04/15/2014 12:25:21 ET JOB#: [id number]  cc: Santina Evans P. Clent Ridges, MD, <Dictator> Gale Journey MD ELECTRONICALLY SIGNED 04/16/2014 9:47

## 2014-07-23 ENCOUNTER — Encounter: Payer: Self-pay | Admitting: *Deleted

## 2014-07-23 ENCOUNTER — Emergency Department: Payer: Medicare Other

## 2014-07-23 ENCOUNTER — Inpatient Hospital Stay
Admission: EM | Admit: 2014-07-23 | Discharge: 2014-07-30 | DRG: 871 | Disposition: A | Discharge status: Home / Self Care | Payer: Medicare Other | Attending: Internal Medicine | Admitting: Internal Medicine

## 2014-07-23 DIAGNOSIS — Z882 Allergy status to sulfonamides status: Secondary | ICD-10-CM

## 2014-07-23 DIAGNOSIS — R748 Abnormal levels of other serum enzymes: Secondary | ICD-10-CM | POA: Diagnosis present

## 2014-07-23 DIAGNOSIS — B961 Klebsiella pneumoniae [K. pneumoniae] as the cause of diseases classified elsewhere: Secondary | ICD-10-CM | POA: Diagnosis present

## 2014-07-23 DIAGNOSIS — R0602 Shortness of breath: Secondary | ICD-10-CM

## 2014-07-23 DIAGNOSIS — I10 Essential (primary) hypertension: Secondary | ICD-10-CM | POA: Diagnosis present

## 2014-07-23 DIAGNOSIS — N179 Acute kidney failure, unspecified: Secondary | ICD-10-CM | POA: Diagnosis present

## 2014-07-23 DIAGNOSIS — N390 Urinary tract infection, site not specified: Secondary | ICD-10-CM | POA: Diagnosis present

## 2014-07-23 DIAGNOSIS — I471 Supraventricular tachycardia: Secondary | ICD-10-CM | POA: Diagnosis not present

## 2014-07-23 DIAGNOSIS — K219 Gastro-esophageal reflux disease without esophagitis: Secondary | ICD-10-CM | POA: Diagnosis present

## 2014-07-23 DIAGNOSIS — F419 Anxiety disorder, unspecified: Secondary | ICD-10-CM | POA: Diagnosis present

## 2014-07-23 DIAGNOSIS — B964 Proteus (mirabilis) (morganii) as the cause of diseases classified elsewhere: Secondary | ICD-10-CM | POA: Diagnosis present

## 2014-07-23 DIAGNOSIS — I4891 Unspecified atrial fibrillation: Secondary | ICD-10-CM

## 2014-07-23 DIAGNOSIS — J441 Chronic obstructive pulmonary disease with (acute) exacerbation: Secondary | ICD-10-CM

## 2014-07-23 DIAGNOSIS — A419 Sepsis, unspecified organism: Principal | ICD-10-CM | POA: Diagnosis present

## 2014-07-23 DIAGNOSIS — E119 Type 2 diabetes mellitus without complications: Secondary | ICD-10-CM | POA: Diagnosis present

## 2014-07-23 DIAGNOSIS — Y95 Nosocomial condition: Secondary | ICD-10-CM | POA: Diagnosis present

## 2014-07-23 DIAGNOSIS — J189 Pneumonia, unspecified organism: Secondary | ICD-10-CM

## 2014-07-23 DIAGNOSIS — I5032 Chronic diastolic (congestive) heart failure: Secondary | ICD-10-CM | POA: Diagnosis present

## 2014-07-23 DIAGNOSIS — Z8673 Personal history of transient ischemic attack (TIA), and cerebral infarction without residual deficits: Secondary | ICD-10-CM

## 2014-07-23 DIAGNOSIS — R6521 Severe sepsis with septic shock: Secondary | ICD-10-CM | POA: Diagnosis present

## 2014-07-23 DIAGNOSIS — J8410 Pulmonary fibrosis, unspecified: Secondary | ICD-10-CM | POA: Diagnosis present

## 2014-07-23 DIAGNOSIS — R0603 Acute respiratory distress: Secondary | ICD-10-CM

## 2014-07-23 DIAGNOSIS — Z66 Do not resuscitate: Secondary | ICD-10-CM | POA: Diagnosis present

## 2014-07-23 HISTORY — DX: Essential (primary) hypertension: I10

## 2014-07-23 HISTORY — DX: Anxiety disorder, unspecified: F41.9

## 2014-07-23 HISTORY — DX: Heart failure, unspecified: I50.9

## 2014-07-23 HISTORY — DX: Transient cerebral ischemic attack, unspecified: G45.9

## 2014-07-23 HISTORY — DX: Chronic obstructive pulmonary disease, unspecified: J44.9

## 2014-07-23 LAB — BASIC METABOLIC PANEL
ANION GAP: 10 (ref 5–15)
BUN: 25 mg/dL — ABNORMAL HIGH (ref 6–20)
CHLORIDE: 94 mmol/L — AB (ref 101–111)
CO2: 31 mmol/L (ref 22–32)
Calcium: 8.8 mg/dL — ABNORMAL LOW (ref 8.9–10.3)
Creatinine, Ser: 1.27 mg/dL — ABNORMAL HIGH (ref 0.44–1.00)
GFR calc Af Amer: 43 mL/min — ABNORMAL LOW (ref >60)
GFR calc non Af Amer: 37 mL/min — ABNORMAL LOW (ref >60)
Glucose, Bld: 113 mg/dL — ABNORMAL HIGH (ref 65–99)
Potassium: 4.2 mmol/L (ref 3.5–5.1)
Sodium: 135 mmol/L (ref 135–145)

## 2014-07-23 LAB — URINALYSIS COMPLETE WITH MICROSCOPIC (ARMC ONLY)
BILIRUBIN URINE: NEGATIVE
BILIRUBIN URINE: NEGATIVE
Glucose, UA: NEGATIVE mg/dL
Glucose, UA: NEGATIVE mg/dL
KETONES UR: NEGATIVE mg/dL
KETONES UR: NEGATIVE mg/dL
Nitrite: NEGATIVE
Nitrite: NEGATIVE
PH: 6 (ref 5.0–8.0)
Protein, ur: NEGATIVE mg/dL
Protein, ur: NEGATIVE mg/dL
SPECIFIC GRAVITY, URINE: 1.006 (ref 1.005–1.030)
Specific Gravity, Urine: 1.008 (ref 1.005–1.030)
pH: 5 (ref 5.0–8.0)

## 2014-07-23 LAB — RAPID INFLUENZA A&B ANTIGENS
Influenza A (ARMC): NOT DETECTED
Influenza B (ARMC): NOT DETECTED

## 2014-07-23 LAB — CBC WITH DIFFERENTIAL/PLATELET
Basophils Absolute: 0.1 10*3/uL (ref 0–0.1)
Basophils Relative: 1 %
EOS PCT: 1 %
Eosinophils Absolute: 0.1 10*3/uL (ref 0–0.7)
HEMATOCRIT: 35.8 % (ref 35.0–47.0)
HEMOGLOBIN: 11.6 g/dL — AB (ref 12.0–16.0)
LYMPHS ABS: 0.7 10*3/uL — AB (ref 1.0–3.6)
Lymphocytes Relative: 7 %
MCH: 29.6 pg (ref 26.0–34.0)
MCHC: 32.2 g/dL (ref 32.0–36.0)
MCV: 91.7 fL (ref 80.0–100.0)
MONO ABS: 1.1 10*3/uL — AB (ref 0.2–0.9)
Monocytes Relative: 10 %
Neutro Abs: 9 10*3/uL — ABNORMAL HIGH (ref 1.4–6.5)
Neutrophils Relative %: 81 %
Platelets: 180 10*3/uL (ref 150–440)
RBC: 3.91 MIL/uL (ref 3.80–5.20)
RDW: 16.6 % — AB (ref 11.5–14.5)
WBC: 11 10*3/uL (ref 3.6–11.0)

## 2014-07-23 LAB — GLUCOSE, CAPILLARY
GLUCOSE-CAPILLARY: 256 mg/dL — AB (ref 70–99)
Glucose-Capillary: 226 mg/dL — ABNORMAL HIGH (ref 70–99)

## 2014-07-23 LAB — TROPONIN I
Troponin I: 0.17 ng/mL — ABNORMAL HIGH (ref <0.031)
Troponin I: 0.16 ng/mL — ABNORMAL HIGH (ref <0.031)

## 2014-07-23 LAB — CREATININE, SERUM
CREATININE: 1.42 mg/dL — AB (ref 0.44–1.00)
GFR calc Af Amer: 38 mL/min — ABNORMAL LOW (ref >60)
GFR, EST NON AFRICAN AMERICAN: 33 mL/min — AB (ref >60)

## 2014-07-23 LAB — CBC
HCT: 31.9 % — ABNORMAL LOW (ref 35.0–47.0)
Hemoglobin: 10.7 g/dL — ABNORMAL LOW (ref 12.0–16.0)
MCH: 30.7 pg (ref 26.0–34.0)
MCHC: 33.4 g/dL (ref 32.0–36.0)
MCV: 91.9 fL (ref 80.0–100.0)
PLATELETS: 157 10*3/uL (ref 150–440)
RBC: 3.47 MIL/uL — ABNORMAL LOW (ref 3.80–5.20)
RDW: 16.7 % — AB (ref 11.5–14.5)
WBC: 9.1 10*3/uL (ref 3.6–11.0)

## 2014-07-23 LAB — LACTIC ACID, PLASMA
LACTIC ACID, VENOUS: 2.3 mmol/L — AB (ref 0.5–2.0)
Lactic Acid, Venous: 2.3 mmol/L (ref 0.5–2.0)

## 2014-07-23 LAB — BRAIN NATRIURETIC PEPTIDE: B Natriuretic Peptide: 222 pg/mL — ABNORMAL HIGH (ref 0.0–100.0)

## 2014-07-23 MED ORDER — ONDANSETRON HCL 4 MG PO TABS: 4.0000 mg | ORAL_TABLET | Freq: Four times a day (QID) | ORAL | Status: DC | PRN

## 2014-07-23 MED ORDER — ONDANSETRON HCL 4 MG/2ML IJ SOLN: 4.0000 mg | Freq: Four times a day (QID) | INTRAMUSCULAR | Status: DC | PRN

## 2014-07-23 MED ORDER — VANCOMYCIN HCL IN DEXTROSE 1-5 GM/200ML-% IV SOLN
1000.0000 mg | Freq: Once | INTRAVENOUS | Status: AC
  Administered 2014-07-23: 1000 mg via INTRAVENOUS
  Filled 2014-07-23: qty 200

## 2014-07-23 MED ORDER — PREGABALIN 25 MG PO CAPS
25.0000 mg | ORAL_CAPSULE | Freq: Three times a day (TID) | ORAL | Status: DC
  Administered 2014-07-23 – 2014-07-30 (×19): 25 mg via ORAL
  Filled 2014-07-23 (×19): qty 1

## 2014-07-23 MED ORDER — ACETAMINOPHEN 325 MG PO TABS
650.0000 mg | ORAL_TABLET | Freq: Four times a day (QID) | ORAL | Status: DC | PRN
  Administered 2014-07-24 – 2014-07-26 (×3): 650 mg via ORAL
  Filled 2014-07-23 (×3): qty 2

## 2014-07-23 MED ORDER — CLOPIDOGREL BISULFATE 75 MG PO TABS
75.0000 mg | ORAL_TABLET | Freq: Once | ORAL | Status: AC
  Administered 2014-07-23: 75 mg via ORAL

## 2014-07-23 MED ORDER — HEPARIN SODIUM (PORCINE) 5000 UNIT/ML IJ SOLN
5000.0000 [IU] | Freq: Three times a day (TID) | INTRAMUSCULAR | Status: DC
  Administered 2014-07-23 – 2014-07-25 (×7): 5000 [IU] via SUBCUTANEOUS
  Filled 2014-07-23 (×7): qty 1

## 2014-07-23 MED ORDER — PROBIOTIC FORMULA PO CAPS
ORAL_CAPSULE | Freq: Every morning | ORAL | Status: DC
  Administered 2014-07-24 – 2014-07-25 (×2): 1 via ORAL
  Administered 2014-07-26: 12:00:00 via ORAL
  Administered 2014-07-27 – 2014-07-30 (×2): 1 via ORAL
  Filled 2014-07-23 (×9): qty 1

## 2014-07-23 MED ORDER — CEFEPIME HCL 1 G IJ SOLR
1.0000 g | INTRAMUSCULAR | Status: DC
  Administered 2014-07-24 – 2014-07-25 (×2): 1 g via INTRAVENOUS
  Filled 2014-07-23 (×4): qty 1

## 2014-07-23 MED ORDER — VANCOMYCIN HCL IN DEXTROSE 1-5 GM/200ML-% IV SOLN
1000.0000 mg | INTRAVENOUS | Status: DC
  Administered 2014-07-23 – 2014-07-24 (×2): 1000 mg via INTRAVENOUS
  Filled 2014-07-23 (×3): qty 200

## 2014-07-23 MED ORDER — CLOPIDOGREL BISULFATE 75 MG PO TABS
75.0000 mg | ORAL_TABLET | Freq: Every day | ORAL | Status: DC
  Administered 2014-07-24 – 2014-07-30 (×6): 75 mg via ORAL
  Filled 2014-07-23 (×7): qty 1

## 2014-07-23 MED ORDER — SODIUM CHLORIDE 0.9 % IV SOLN
INTRAVENOUS | Status: DC
  Administered 2014-07-23 – 2014-07-25 (×3): via INTRAVENOUS

## 2014-07-23 MED ORDER — DEXTROSE 5 % IV SOLN
2.0000 g | Freq: Once | INTRAVENOUS | Status: AC
  Administered 2014-07-23: 2 g via INTRAVENOUS
  Filled 2014-07-23: qty 2

## 2014-07-23 MED ORDER — ISOSORBIDE MONONITRATE ER 30 MG PO TB24: 30.0000 mg | ORAL_TABLET | Freq: Every day | ORAL | Status: DC

## 2014-07-23 MED ORDER — METHYLPREDNISOLONE SODIUM SUCC 125 MG IJ SOLR
INTRAMUSCULAR | Status: AC
  Filled 2014-07-23: qty 2

## 2014-07-23 MED ORDER — POTASSIUM CHLORIDE ER 10 MEQ PO TBCR
10.0000 meq | EXTENDED_RELEASE_TABLET | Freq: Every day | ORAL | Status: DC
Start: 2014-07-23 — End: 2014-07-30
  Administered 2014-07-24 – 2014-07-30 (×7): 10 meq via ORAL
  Filled 2014-07-23 (×15): qty 1

## 2014-07-23 MED ORDER — IPRATROPIUM-ALBUTEROL 0.5-2.5 (3) MG/3ML IN SOLN
RESPIRATORY_TRACT | Status: AC
  Filled 2014-07-23: qty 3

## 2014-07-23 MED ORDER — GABAPENTIN 300 MG PO CAPS
600.0000 mg | ORAL_CAPSULE | Freq: Three times a day (TID) | ORAL | Status: DC
  Administered 2014-07-23 – 2014-07-30 (×20): 600 mg via ORAL
  Filled 2014-07-23 (×20): qty 2

## 2014-07-23 MED ORDER — INSULIN ASPART 100 UNIT/ML ~~LOC~~ SOLN
0.0000 [IU] | Freq: Every day | SUBCUTANEOUS | Status: DC
  Administered 2014-07-23: 3 [IU] via SUBCUTANEOUS
  Administered 2014-07-26 – 2014-07-28 (×2): 2 [IU] via SUBCUTANEOUS
  Administered 2014-07-29: 3 [IU] via SUBCUTANEOUS
  Filled 2014-07-23 (×2): qty 2
  Filled 2014-07-23 (×3): qty 3

## 2014-07-23 MED ORDER — MOMETASONE FURO-FORMOTEROL FUM 200-5 MCG/ACT IN AERO
2.0000 | INHALATION_SPRAY | Freq: Two times a day (BID) | RESPIRATORY_TRACT | Status: DC
  Administered 2014-07-23 – 2014-07-30 (×13): 2 via RESPIRATORY_TRACT
  Filled 2014-07-23 (×2): qty 8.8

## 2014-07-23 MED ORDER — ALBUTEROL SULFATE (2.5 MG/3ML) 0.083% IN NEBU
3.0000 mL | INHALATION_SOLUTION | Freq: Four times a day (QID) | RESPIRATORY_TRACT | Status: DC | PRN
  Administered 2014-07-25: 3 mL via RESPIRATORY_TRACT
  Filled 2014-07-23: qty 3

## 2014-07-23 MED ORDER — PREDNISONE 1 MG PO TABS
5.0000 mg | ORAL_TABLET | Freq: Two times a day (BID) | ORAL | Status: DC
  Administered 2014-07-24 (×2): 5 mg via ORAL
  Administered 2014-07-25 – 2014-07-26 (×2): 10 mg via ORAL
  Administered 2014-07-26 – 2014-07-29 (×6): 5 mg via ORAL
  Filled 2014-07-23: qty 2
  Filled 2014-07-23 (×3): qty 5
  Filled 2014-07-23 (×5): qty 1
  Filled 2014-07-23: qty 5

## 2014-07-23 MED ORDER — ATORVASTATIN CALCIUM 20 MG PO TABS
80.0000 mg | ORAL_TABLET | Freq: Every day | ORAL | Status: DC
  Administered 2014-07-23 – 2014-07-29 (×8): 80 mg via ORAL
  Filled 2014-07-23 (×8): qty 4

## 2014-07-23 MED ORDER — METHYLPREDNISOLONE SODIUM SUCC 125 MG IJ SOLR
125.0000 mg | Freq: Once | INTRAMUSCULAR | Status: AC
  Administered 2014-07-23: 125 mg via INTRAVENOUS

## 2014-07-23 MED ORDER — MONTELUKAST SODIUM 10 MG PO TABS
10.0000 mg | ORAL_TABLET | Freq: Every day | ORAL | Status: DC
  Administered 2014-07-23 – 2014-07-29 (×7): 10 mg via ORAL
  Filled 2014-07-23 (×7): qty 1

## 2014-07-23 MED ORDER — ACETAMINOPHEN 650 MG RE SUPP: 650.0000 mg | Freq: Four times a day (QID) | RECTAL | Status: DC | PRN

## 2014-07-23 MED ORDER — FUROSEMIDE 10 MG/ML IJ SOLN: 40.0000 mg | Freq: Once | INTRAMUSCULAR | Status: DC

## 2014-07-23 MED ORDER — CLOPIDOGREL BISULFATE 75 MG PO TABS
ORAL_TABLET | ORAL | Status: AC
  Filled 2014-07-23: qty 1

## 2014-07-23 MED ORDER — INSULIN ASPART 100 UNIT/ML ~~LOC~~ SOLN
0.0000 [IU] | Freq: Three times a day (TID) | SUBCUTANEOUS | Status: DC
  Administered 2014-07-24: 1 [IU] via SUBCUTANEOUS
  Administered 2014-07-24 – 2014-07-25 (×2): 2 [IU] via SUBCUTANEOUS
  Filled 2014-07-23: qty 2
  Filled 2014-07-23: qty 1

## 2014-07-23 MED ORDER — IPRATROPIUM-ALBUTEROL 20-100 MCG/ACT IN AERS
1.0000 | INHALATION_SPRAY | Freq: Four times a day (QID) | RESPIRATORY_TRACT | Status: DC
  Administered 2014-07-23 – 2014-07-30 (×25): 1 via RESPIRATORY_TRACT
  Filled 2014-07-23: qty 4

## 2014-07-23 MED ORDER — CARVEDILOL 3.125 MG PO TABS: 3.1250 mg | ORAL_TABLET | Freq: Every morning | ORAL | Status: DC

## 2014-07-23 MED ORDER — SODIUM CHLORIDE 0.9 % IV BOLUS (SEPSIS)
250.0000 mL | Freq: Once | INTRAVENOUS | Status: AC
  Administered 2014-07-23: 250 mL via INTRAVENOUS

## 2014-07-23 MED ORDER — TRAMADOL HCL 50 MG PO TABS
50.0000 mg | ORAL_TABLET | Freq: Two times a day (BID) | ORAL | Status: DC | PRN
  Administered 2014-07-25 – 2014-07-28 (×4): 50 mg via ORAL
  Filled 2014-07-23 (×5): qty 1

## 2014-07-23 MED ORDER — PANTOPRAZOLE SODIUM 40 MG PO TBEC
40.0000 mg | DELAYED_RELEASE_TABLET | Freq: Every morning | ORAL | Status: DC
  Administered 2014-07-24 – 2014-07-30 (×7): 40 mg via ORAL
  Filled 2014-07-23 (×8): qty 1

## 2014-07-23 MED ORDER — IPRATROPIUM-ALBUTEROL 0.5-2.5 (3) MG/3ML IN SOLN
9.0000 mL | Freq: Once | RESPIRATORY_TRACT | Status: AC
  Administered 2014-07-23: 9 mL via RESPIRATORY_TRACT

## 2014-07-23 MED ORDER — ESCITALOPRAM OXALATE 10 MG PO TABS
10.0000 mg | ORAL_TABLET | Freq: Every day | ORAL | Status: DC
  Administered 2014-07-24 – 2014-07-30 (×6): 10 mg via ORAL
  Filled 2014-07-23 (×7): qty 1

## 2014-07-23 MED ORDER — CLONAZEPAM 0.5 MG PO TABS
0.5000 mg | ORAL_TABLET | Freq: Three times a day (TID) | ORAL | Status: DC | PRN
  Administered 2014-07-23 – 2014-07-29 (×11): 0.5 mg via ORAL
  Filled 2014-07-23 (×11): qty 1

## 2014-07-23 MED ORDER — GABAPENTIN 600 MG PO TABS
600.0000 mg | ORAL_TABLET | Freq: Three times a day (TID) | ORAL | Status: DC
  Filled 2014-07-23 (×5): qty 1

## 2014-07-23 MED ORDER — LEFLUNOMIDE 20 MG PO TABS
10.0000 mg | ORAL_TABLET | Freq: Every morning | ORAL | Status: DC
  Administered 2014-07-24 – 2014-07-30 (×7): 10 mg via ORAL
  Filled 2014-07-23 (×7): qty 1

## 2014-07-23 MED ORDER — CETYLPYRIDINIUM CHLORIDE 0.05 % MT LIQD
7.0000 mL | Freq: Two times a day (BID) | OROMUCOSAL | Status: DC
  Administered 2014-07-23 – 2014-07-30 (×11): 7 mL via OROMUCOSAL
  Filled 2014-07-23 (×4): qty 7

## 2014-07-23 NOTE — ED Notes (Signed)
Activated Code Sepsis through [employer]

## 2014-07-23 NOTE — ED Provider Notes (Signed)
[facility] Emergency Department Provider Note  ____________________________________________  Time seen: Approximately 10:47 AM  I have reviewed the triage vital signs and the nursing notes.   HISTORY  Chief Complaint Shortness of Breath    HPI Denise Duke is a 81 y.o. female with a history of COPD and CHF who presents today with a coughing spell with 1 episode of posttussive emesis. Patient says that she now feels back to her baseline. Is not having any chest pain or shortness of breath at time of this interview and not any chest pain or shortness of breath earlier today. She does note some mild bilateral ankle swelling. She says that she is not wearing her support hose which helped with this issue.She was given one albuterol treatment en route to the hospital. She says that she is on a baseline of 3 L nasal cannula oxygen all the time.   Past Medical History  Diagnosis Date  . CHF (congestive heart failure)   . TIA (transient ischemic attack)   . Anxiety   . COPD (chronic obstructive pulmonary disease)   . Hypertension     Patient Active Problem List   Diagnosis Date Noted  . UTI (lower urinary tract infection) 07/23/2014    No past surgical history on file.  Current Outpatient Rx  Name  Route  Sig  Dispense  Refill  . acetaminophen (TYLENOL) 500 MG tablet   Oral   Take 500 mg by mouth 2 (two) times daily as needed (pain).         Marland Kitchen albuterol (PROVENTIL) (2.5 MG/3ML) 0.083% nebulizer solution   Nebulization   Take 3 mLs by nebulization every 6 (six) hours as needed for wheezing.         Marland Kitchen alendronate (FOSAMAX) 70 MG tablet   Oral   Take 70 mg by mouth once a week. Take with a full glass of water on an empty stomach.         Marland Kitchen atorvastatin (LIPITOR) 80 MG tablet   Oral   Take 80 mg by mouth at bedtime.         . Calcium Carbonate-Vitamin D 600-400 MG-UNIT per tablet   Oral   Take 1 tablet by mouth 2 (two) times daily.         . capsaicin (ZOSTRIX) 0.025 % cream   Topical   Apply topically 2 (two) times daily. Apply topically to affected area 2 times a day, as needed for pain         . carvedilol (COREG) 3.125 MG tablet   Oral   Take 3.125 mg by mouth every morning.         . celecoxib (CELEBREX) 200 MG capsule   Oral   Take 200 mg by mouth daily as needed (as needed for pain).         . clonazePAM (KLONOPIN) 0.5 MG tablet   Oral   Take 0.5 mg by mouth 3 (three) times daily as needed for anxiety (nervousness).         . clopidogrel (PLAVIX) 75 MG tablet   Oral   Take 75 mg by mouth daily.         Marland Kitchen CRANBERRY EXTRACT PO   Oral   Take 425 mg by mouth daily. Cranberry tablet         . escitalopram (LEXAPRO) 10 MG tablet   Oral   Take 10 mg by mouth daily.         Marland Kitchen  Fluticasone-Salmeterol (ADVAIR) 500-50 MCG/DOSE AEPB   Inhalation   Inhale 1 puff into the lungs 2 (two) times daily.         . furosemide (LASIX) 20 MG tablet   Oral   Take 20 mg by mouth daily.         Marland Kitchen gabapentin (NEURONTIN) 600 MG tablet   Oral   Take 600 mg by mouth 3 (three) times daily.         . Ipratropium-Albuterol (COMBIVENT) 20-100 MCG/ACT AERS respimat   Inhalation   Inhale 1 puff into the lungs 4 (four) times daily.         . isosorbide mononitrate (IMDUR) 30 MG 24 hr tablet   Oral   Take 30 mg by mouth daily.         Marland Kitchen leflunomide (ARAVA) 20 MG tablet   Oral   Take 10 mg by mouth every morning. Take half tab of 20mg          . lisinopril (PRINIVIL,ZESTRIL) 2.5 MG tablet   Oral   Take 1.25 mg by mouth daily. Take half tab of 2.5mg          . loperamide (IMODIUM) 2 MG capsule   Oral   Take by mouth 4 (four) times daily as needed for diarrhea or loose stools.         . montelukast (SINGULAIR) 10 MG tablet   Oral   Take 10 mg by mouth at bedtime.         . pantoprazole (PROTONIX) 40 MG tablet   Oral   Take 40 mg by mouth every morning.         . potassium chloride  (K-DUR) 10 MEQ tablet   Oral   Take 10 mEq by mouth daily.         . predniSONE (DELTASONE) 5 MG tablet   Oral   Take 5 mg by mouth 2 (two) times daily with a meal.         . pregabalin (LYRICA) 25 MG capsule   Oral   Take 25 mg by mouth 3 (three) times daily.         . Probiotic Product (PROBIOTIC FORMULA PO)   Oral   Take by mouth every morning. Probiotic Formula-oral cap         . traMADol (ULTRAM) 50 MG tablet   Oral   Take 50 mg by mouth 2 (two) times daily as needed (pain).         albuterol-ipratropium (COMBIVENT) 18-103 MCG/ACT inhaler   Inhalation   Inhale into the lungs every 4 (four) hours.         . calcium carbonate (OS-CAL) 600 MG TABS tablet   Oral   Take 600 mg by mouth 2 (two) times daily with a meal.           Allergies Aspirin; Cephalosporins; Levaquin; and Sulfa antibiotics  No family history on file.  Social History History  Substance Use Topics  . Smoking status: Never Smoker   . Smokeless tobacco: Not on file  . Alcohol Use: No    Review of Systems Constitutional: No fever/chills Eyes: No visual changes. ENT: No sore throat. Cardiovascular: Denies chest pain. Respiratory: Denies shortness of breath. Gastrointestinal: No abdominal pain.  No diarrhea.  No constipation. Genitourinary: Negative for dysuria. Musculoskeletal: Negative for back pain. Skin: Negative for rash. Neurological: Negative for headaches, focal weakness or numbness.  10-point ROS otherwise negative.  ____________________________________________   PHYSICAL EXAM:  VITAL SIGNS: ED Triage Vitals  Enc Vitals Group     BP --      Pulse --      Resp --      Temp --      Temp src --      SpO2 07/23/14 1040 100 %     Weight --      Height --      Head Cir --      Peak Flow --      Pain Score --      Pain Loc --      Pain Edu? --      Excl. in GC? --     Constitutional: Alert and oriented. Well appearing and in no acute distress. Eyes:  Conjunctivae are normal. PERRL. EOMI. Head: Atraumatic. Nose: No congestion/rhinnorhea. Wearing nasal cannula oxygen 2 L at 98-100%. Mouth/Throat: Mucous membranes are moist.  Oropharynx non-erythematous. Neck: No stridor.   Cardiovascular: Normal rate, regular rhythm. Grossly normal heart sounds.  Good peripheral circulation. Respiratory: Tachypneic with mildly labored breathing. Decreased air movement with wheezing throughout as well as rales to the lower and mid fields.  Gastrointestinal: Soft and nontender. No distention. No abdominal bruits. No CVA tenderness. Musculoskeletal: Bilateral mild lower sternum and edema.  No joint effusions. Neurologic:  Normal speech and language. No gross focal neurologic deficits are appreciated. Speech is normal. No gait instability. Skin:  Skin is warm, dry and intact. No rash noted. Psychiatric: Mood and affect are normal. Speech and behavior are normal.  ____________________________________________   LABS (all labs ordered are listed, but only abnormal results are displayed)  Labs Reviewed  CBC WITH DIFFERENTIAL/PLATELET - Abnormal; Notable for the following:    Hemoglobin 11.6 (*)    RDW 16.6 (*)    Neutro Abs 9.0 (*)    Lymphs Abs 0.7 (*)    Monocytes Absolute 1.1 (*)    All other components within normal limits  BASIC METABOLIC PANEL - Abnormal; Notable for the following:    Chloride 94 (*)    Glucose, Bld 113 (*)    BUN 25 (*)    Creatinine, Ser 1.27 (*)    Calcium 8.8 (*)    GFR calc non Af Amer 37 (*)    GFR calc Af Amer 43 (*)    All other components within normal limits  BRAIN NATRIURETIC PEPTIDE - Abnormal; Notable for the following:    B Natriuretic Peptide 222.0 (*)    All other components within normal limits  TROPONIN I - Abnormal; Notable for the following:    Troponin I 0.17 (*)    All other components within normal limits  URINALYSIS COMPLETEWITH MICROSCOPIC ([facility])  - Abnormal; Notable for the following:    Color,  Urine STRAW (*)    APPearance HAZY (*)    Hgb urine dipstick 2+ (*)    Leukocytes, UA 2+ (*)    Bacteria, UA MANY (*)    Squamous Epithelial / LPF 0-5 (*)    All other components within normal limits  LACTIC ACID, PLASMA - Abnormal; Notable for the following:    Lactic Acid, Venous 2.3 (*)    All other components within normal limits  INFLUENZA A&B ANTIGENS([facility])  CULTURE, BLOOD (ROUTINE X 2)  CULTURE, BLOOD (ROUTINE X 2)  URINE CULTURE  LACTIC ACID, PLASMA   ____________________________________________  EKG  ED ECG REPORT   Date: 07/23/2014  EKG Time: 1113  Rate: 118  Rhythm: normal EKG, normal sinus rhythm,  unchanged from previous tracings, sinus tachycardia with PVCs  Axis: Normal  Intervals:left anterior fascicular block  ST&T Change: No ST elevations or depressions no T-wave inversions.  ____________________________________________  RADIOLOGY  Enlarged cardiac silhouette. Pulmonary fibrosis. ____________________________________________     ____________________________________________   INITIAL IMPRESSION / ASSESSMENT AND PLAN / ED COURSE  Pertinent labs & imaging results that were available during my care of the patient were reviewed by me and considered in my medical decision making (see chart for details).  We'll give dose of Lasix IV. Pending BNP and chest x-ray.  ----------------------------------------- 1:49 PM on 07/23/2014 -----------------------------------------  IV Lasix held because of no pulmonary edema read on chest x-ray. Patient says she does not have any chest pain or shortness of breath at this time. Found to have urinary tract infection. Given cefepime which should cover this. Also receiving fluids at this time because of elevated lactic acid. We'll admit to the hospital. Elevated troponin, likely related to strain from tachycardia. Ordered daily Plavix. ____________________________________________   FINAL CLINICAL IMPRESSION(S) / ED  DIAGNOSES  Acute sepsis. Acute urinary tract infection. Initial visit.    Myrna Blazer, MD 07/23/14 1351

## 2014-07-23 NOTE — Progress Notes (Signed)
RN made Dr. Imogene Burn aware that patient's BP has been systolically in 90's with MAP 60-63 and that patient is lethargic. Dr. Imogene Burn stated " I will order a bolus, i dont want to have to put her on levophed."  Denise Duke B

## 2014-07-23 NOTE — Plan of Care (Signed)
Problem: Phase I Progression Outcomes Goal: Pain controlled with appropriate interventions Outcome: Not Applicable Date Met:  07/23/14 No c/o of pain Goal: Vital Signs stable- temperature less than 102 Outcome: Not Met (add Reason) BP low     

## 2014-07-23 NOTE — H&P (Signed)
[facility] - [place] at [facility]   PATIENT NAME: Denise Duke    MR#:  [id number]  DATE OF BIRTH:  Oct 29, 1927  DATE OF ADMISSION:  07/23/2014  PRIMARY CARE PHYSICIAN: Rolm Gala, MD   REQUESTING/REFERRING PHYSICIAN: DR. Pershing Proud.  CHIEF COMPLAINT:   Chief Complaint  Patient presents with  . Shortness of Breath   generalized weakness  HISTORY OF PRESENT ILLNESS:  Denise Duke  is a 81 y.o. female with a known history of CHF, COPD, hypertension and TIA. Patient was sent to the ED from nursing home due to generalized weakness. The patient is alert, awake and oriented. The patient complains of a generalized  weakness, mild shortness breath and dehydration. But the patient denies any fever, chills, chest pain or palpitation. The patient was found to have a UTI and tachycardia. The patient's blood pressure decreased to 80 over 70, she is being treated with a normal saline bolus now. She is also treated with the cefepime and vancomycin ED.  PAST MEDICAL HISTORY:   Past Medical History  Diagnosis Date  . CHF (congestive heart failure)   . TIA (transient ischemic attack)   . Anxiety   . COPD (chronic obstructive pulmonary disease)   . Hypertension     PAST SURGICAL HISTORY:  No past surgical history on file.  SOCIAL HISTORY:   History  Substance Use Topics  . Smoking status: Never Smoker   . Smokeless tobacco: Not on file  . Alcohol Use: No    FAMILY HISTORY:  No family history on file.  DRUG ALLERGIES:   Allergies  Allergen Reactions  . Aspirin Shortness Of Breath  . Cephalosporins Other (See Comments)    Difficulty walking  . Levaquin [Levofloxacin In D5w] Other (See Comments)    GI Distress, Cramps  . Sulfa Antibiotics Other (See Comments)    unknown    REVIEW OF SYSTEMS:  CONSTITUTIONAL: No fever or chills, but has a generalized weakness and poor oral intake.  EYES: No blurred or double vision.  EARS, NOSE, AND THROAT: No  tinnitus or ear pain.  RESPIRATORY: No cough, shortness of breath, wheezing or hemoptysis.  CARDIOVASCULAR: No chest pain, orthopnea, edema.  GASTROINTESTINAL: No nausea, vomiting, diarrhea or abdominal pain.  GENITOURINARY: No dysuria, hematuria. But has urine incontinence. ENDOCRINE: No polyuria, nocturia,  HEMATOLOGY: No anemia, easy bruising or bleeding SKIN: No rash or lesion. MUSCULOSKELETAL: No joint pain or arthritis.   NEUROLOGIC: No tingling, numbness, weakness.  PSYCHIATRY: No anxiety or depression.   MEDICATIONS AT HOME:   Prior to Admission medications   Medication Sig Start Date End Date Taking? Authorizing Provider  acetaminophen (TYLENOL) 500 MG tablet Take 500 mg by mouth 2 (two) times daily as needed (pain).   Yes Historical Provider, MD  albuterol (PROVENTIL) (2.5 MG/3ML) 0.083% nebulizer solution Take 3 mLs by nebulization every 6 (six) hours as needed for wheezing.   Yes Historical Provider, MD  alendronate (FOSAMAX) 70 MG tablet Take 70 mg by mouth once a week. Take with a full glass of water on an empty stomach.   Yes Historical Provider, MD  atorvastatin (LIPITOR) 80 MG tablet Take 80 mg by mouth at bedtime.   Yes Historical Provider, MD  Calcium Carbonate-Vitamin D 600-400 MG-UNIT per tablet Take 1 tablet by mouth 2 (two) times daily.   Yes Historical Provider, MD  capsaicin (ZOSTRIX) 0.025 % cream Apply topically 2 (two) times daily. Apply topically to affected area 2 times a day, as needed for  pain   Yes Historical Provider, MD  carvedilol (COREG) 3.125 MG tablet Take 3.125 mg by mouth every morning.   Yes Historical Provider, MD  celecoxib (CELEBREX) 200 MG capsule Take 200 mg by mouth daily as needed (as needed for pain).   Yes Historical Provider, MD  clonazePAM (KLONOPIN) 0.5 MG tablet Take 0.5 mg by mouth 3 (three) times daily as needed for anxiety (nervousness).   Yes Historical Provider, MD  clopidogrel (PLAVIX) 75 MG tablet Take 75 mg by mouth daily.   Yes  Historical Provider, MD  CRANBERRY EXTRACT PO Take 425 mg by mouth daily. Cranberry tablet   Yes Historical Provider, MD  escitalopram (LEXAPRO) 10 MG tablet Take 10 mg by mouth daily.   Yes Historical Provider, MD  Fluticasone-Salmeterol (ADVAIR) 500-50 MCG/DOSE AEPB Inhale 1 puff into the lungs 2 (two) times daily.   Yes Historical Provider, MD  furosemide (LASIX) 20 MG tablet Take 20 mg by mouth daily.   Yes Historical Provider, MD  gabapentin (NEURONTIN) 600 MG tablet Take 600 mg by mouth 3 (three) times daily.   Yes Historical Provider, MD  Ipratropium-Albuterol (COMBIVENT) 20-100 MCG/ACT AERS respimat Inhale 1 puff into the lungs 4 (four) times daily.   Yes Historical Provider, MD  isosorbide mononitrate (IMDUR) 30 MG 24 hr tablet Take 30 mg by mouth daily.   Yes Historical Provider, MD  leflunomide (ARAVA) 20 MG tablet Take 10 mg by mouth every morning. Take half tab of 20mg    Yes Historical Provider, MD  lisinopril (PRINIVIL,ZESTRIL) 2.5 MG tablet Take 1.25 mg by mouth daily. Take half tab of 2.5mg    Yes Historical Provider, MD  loperamide (IMODIUM) 2 MG capsule Take by mouth 4 (four) times daily as needed for diarrhea or loose stools.   Yes Historical Provider, MD  montelukast (SINGULAIR) 10 MG tablet Take 10 mg by mouth at bedtime.   Yes Historical Provider, MD  pantoprazole (PROTONIX) 40 MG tablet Take 40 mg by mouth every morning.   Yes Historical Provider, MD  potassium chloride (K-DUR) 10 MEQ tablet Take 10 mEq by mouth daily.   Yes Historical Provider, MD  predniSONE (DELTASONE) 5 MG tablet Take 5 mg by mouth 2 (two) times daily with a meal.   Yes Historical Provider, MD  pregabalin (LYRICA) 25 MG capsule Take 25 mg by mouth 3 (three) times daily.   Yes Historical Provider, MD  Probiotic Product (PROBIOTIC FORMULA PO) Take by mouth every morning. Probiotic Formula-oral cap   Yes Historical Provider, MD  traMADol (ULTRAM) 50 MG tablet Take 50 mg by mouth 2 (two) times daily as needed  (pain).   Yes Historical Provider, MD  albuterol-ipratropium (COMBIVENT) 18-103 MCG/ACT inhaler Inhale into the lungs every 4 (four) hours.    Historical Provider, MD  calcium carbonate (OS-CAL) 600 MG TABS tablet Take 600 mg by mouth 2 (two) times daily with a meal.    Historical Provider, MD      VITAL SIGNS:  Blood pressure 112/66, SpO2 100 %.  PHYSICAL EXAMINATION:  GENERAL:  81 y.o.-year-old patient lying in the bed with no acute distress.  EYES: Pupils equal, round, reactive to light and accommodation. No scleral icterus. Extraocular muscles intact.  HEENT: Head atraumatic, normocephalic. Oropharynx and nasopharynx clear.  NECK:  Supple, no jugular venous distention. No thyroid enlargement, no tenderness.  LUNGS: Normal breath sounds bilaterally, no wheezing, rales,rhonchi or crepitation. No use of accessory muscles of respiration. But has a coarse breath sound. CARDIOVASCULAR: S1, S2 normal. No  murmurs, rubs, or gallops.  ABDOMEN: Soft, nontender, nondistended. Bowel sounds present. No organomegaly or mass.  EXTREMITIES: Bilateral lower extremity 1+ edema, cyanosis, or clubbing.  NEUROLOGIC: Cranial nerves II through XII are intact. Muscle strength 3-4/5 in all extremities. Sensation intact. Gait not checked.  PSYCHIATRIC: The patient is alert and oriented x 3.  SKIN: No obvious rash, lesion, or ulcer.   LABORATORY PANEL:   CBC  Recent Labs Lab 07/23/14 1145  WBC 11.0  HGB 11.6*  HCT 35.8  PLT 180   ------------------------------------------------------------------------------------------------------------------  Chemistries   Recent Labs Lab 07/23/14 1145  NA 135  K 4.2  CL 94*  CO2 31  GLUCOSE 113*  BUN 25*  CREATININE 1.27*  CALCIUM 8.8*   ------------------------------------------------------------------------------------------------------------------  Cardiac Enzymes  Recent Labs Lab 07/23/14 1145  TROPONINI 0.17*    ------------------------------------------------------------------------------------------------------------------  RADIOLOGY:  Dg Chest Port 1 View  07/23/2014   CLINICAL DATA:  Severe shortness of breath this morning, history COPD, home oxygen, CHF, hypertension  EXAM: PORTABLE CHEST - 1 VIEW  COMPARISON:  Portable exam 1053 hours compared to 07/11/2014  FINDINGS: Enlargement of cardiac silhouette.  Mediastinal contours and pulmonary vascularity normal.  Interstitial changes peripherally in the mid to lower lungs most consistent with pulmonary fibrosis.  No definite superimposed infiltrate, pleural effusion or edema.  No pneumothorax.  Bones demineralized with BILATERAL glenohumeral degenerative changes.  IMPRESSION: Enlargement of cardiac silhouette.  Pulmonary fibrosis.  No definite acute abnormalities.   Electronically Signed   By: Ulyses Southward M.D.   On: 07/23/2014 11:25    EKG:   Orders placed or performed during the hospital encounter of 07/23/14  . ED EKG  . ED EKG    IMPRESSION AND PLAN:   Septic shock, sepsis and UTI. I will continue cefepime and vancomycin, follow-up CBC, urine culture and blood culture. I will hold hypertension medication due to hypotension. Acute renal failure. I will hold lisinopril, start normal saline IV, follow-up BMP. Elevated troponin. Possibly due to septic shock and acute renal failure, follow-up troponin level and continue aspirin, Plavix and statin. Chronic diastolic CHF. Stable, I will stop Lasix due to septic shock. COPD. Stable. I will continue patient home nebulizer medication.      All the records are reviewed and case discussed with ED provider. Management plans discussed with the patient, the patient's sister and they are in agreement.  CODE STATUS: DNR. TOTAL TIME TAKING CRITICAL CARE OF THIS PATIENT: 67 minutes.    Shaune Pollack M.D on 07/23/2014 at 2:26 PM  Between 7am to 6pm - Pager - [phone]  After 6pm go to [email or web address]  - password [email or web address] [facility]  [place] [facility]  Office  [phone]  CC: Primary care physician; Rolm Gala, MD

## 2014-07-23 NOTE — Progress Notes (Signed)
Patient remains lethargic. Arousable to voice but drifts off during conversation. A&Ox4. Denies pain. Receiving bolus at this time. NSR per cardiac monitor. Afebrile at this time.  Denise Duke B

## 2014-07-24 LAB — CBC
HCT: 30.9 % — ABNORMAL LOW (ref 35.0–47.0)
Hemoglobin: 9.9 g/dL — ABNORMAL LOW (ref 12.0–16.0)
MCH: 29.4 pg (ref 26.0–34.0)
MCHC: 32 g/dL (ref 32.0–36.0)
MCV: 91.8 fL (ref 80.0–100.0)
Platelets: 151 10*3/uL (ref 150–440)
RBC: 3.36 MIL/uL — ABNORMAL LOW (ref 3.80–5.20)
RDW: 16.2 % — AB (ref 11.5–14.5)
WBC: 8.6 10*3/uL (ref 3.6–11.0)

## 2014-07-24 LAB — BASIC METABOLIC PANEL
Anion gap: 9 (ref 5–15)
BUN: 25 mg/dL — ABNORMAL HIGH (ref 6–20)
CO2: 27 mmol/L (ref 22–32)
Calcium: 7.9 mg/dL — ABNORMAL LOW (ref 8.9–10.3)
Chloride: 103 mmol/L (ref 101–111)
Creatinine, Ser: 0.96 mg/dL (ref 0.44–1.00)
GFR, EST NON AFRICAN AMERICAN: 52 mL/min — AB (ref >60)
GLUCOSE: 158 mg/dL — AB (ref 65–99)
Potassium: 3.7 mmol/L (ref 3.5–5.1)
SODIUM: 139 mmol/L (ref 135–145)

## 2014-07-24 LAB — MAGNESIUM: MAGNESIUM: 1.7 mg/dL (ref 1.7–2.4)

## 2014-07-24 LAB — GLUCOSE, CAPILLARY
Glucose-Capillary: 122 mg/dL — ABNORMAL HIGH (ref 70–99)
Glucose-Capillary: 174 mg/dL — ABNORMAL HIGH (ref 70–99)
Glucose-Capillary: 95 mg/dL (ref 70–99)
Glucose-Capillary: 187 mg/dL — ABNORMAL HIGH (ref 70–99)

## 2014-07-24 NOTE — Progress Notes (Signed)
Report called to Denny Peon, RN prior to transfer to RM 104 from ICU 16.  Patient belonging packed and ready to send with patient.

## 2014-07-24 NOTE — Progress Notes (Signed)
Spoke with Dr. Clint Guy about Pt's 7 and 12 beat run of V-tach. Orders to add Mg check to morning labs. Will continue to monitor.

## 2014-07-24 NOTE — Progress Notes (Signed)
[facility] Physicians - [place] at [facility]   PATIENT NAME: Denise Duke    MR#:  [id number]  DATE OF BIRTH:  1928/01/14  SUBJECTIVE:  CHIEF COMPLAINT:  Pt is awake and alert , feels better, tolerating PO diet  REVIEW OF SYSTEMS:  CONSTITUTIONAL: No fever, fatigue or weakness.  EYES: No blurred or double vision.  EARS, NOSE, AND THROAT: No tinnitus or ear pain.  RESPIRATORY: No cough, shortness of breath, wheezing or hemoptysis.  CARDIOVASCULAR: No chest pain, orthopnea, edema.  GASTROINTESTINAL: No nausea, vomiting, diarrhea or abdominal pain.  GENITOURINARY: No dysuria, hematuria.  ENDOCRINE: No polyuria, nocturia,  HEMATOLOGY: No anemia, easy bruising or bleeding SKIN: No rash or lesion. MUSCULOSKELETAL: No joint pain or arthritis.   NEUROLOGIC: No tingling, numbness, weakness.  PSYCHIATRY: No anxiety or depression.   DRUG ALLERGIES:   Allergies  Allergen Reactions  . Aspirin Shortness Of Breath  . Cephalosporins Other (See Comments)    Difficulty walking  . Levaquin [Levofloxacin In D5w] Other (See Comments)    GI Distress, Cramps  . Sulfa Antibiotics Other (See Comments)    unknown    VITALS:  Blood pressure 126/71, pulse 78, temperature 97.6 F (36.4 C), temperature source Oral, resp. rate 20, height 4\' 9"  (1.448 m), weight 79.8 kg (175 lb 14.8 oz), SpO2 98 %.  PHYSICAL EXAMINATION:  GENERAL:  81 y.o.-year-old patient lying in the bed with no acute distress.  EYES: Pupils equal, round, reactive to light and accommodation. No scleral icterus. Extraocular muscles intact.  HEENT: Head atraumatic, normocephalic. Oropharynx and nasopharynx clear.  NECK:  Supple, no jugular venous distention. No thyroid enlargement, no tenderness.  LUNGS: Normal breath sounds bilaterally, no wheezing, rales,rhonchi or crepitation. No use of accessory muscles of respiration.  CARDIOVASCULAR: S1, S2 normal. No murmurs, rubs, or gallops.  ABDOMEN: Soft, nontender,  nondistended. Bowel sounds present. No organomegaly or mass.  EXTREMITIES: No pedal edema, cyanosis, or clubbing.  NEUROLOGIC: Cranial nerves II through XII are intact. Muscle strength 5/5 in all extremities. Sensation intact. Gait not checked.  PSYCHIATRIC: The patient is alert and oriented x 3.  SKIN: No obvious rash, lesion, or ulcer.    LABORATORY PANEL:   CBC  Recent Labs Lab 07/24/14 0330  WBC 8.6  HGB 9.9*  HCT 30.9*  PLT 151   ------------------------------------------------------------------------------------------------------------------  Chemistries   Recent Labs Lab 07/24/14 0330  NA 139  K 3.7  CL 103  CO2 27  GLUCOSE 158*  BUN 25*  CREATININE 0.96  CALCIUM 7.9*  MG 1.7   ------------------------------------------------------------------------------------------------------------------  Cardiac Enzymes  Recent Labs Lab 07/23/14 1643  TROPONINI 0.16*   ------------------------------------------------------------------------------------------------------------------  RADIOLOGY:  Dg Chest Port 1 View  07/23/2014   CLINICAL DATA:  Severe shortness of breath this morning, history COPD, home oxygen, CHF, hypertension  EXAM: PORTABLE CHEST - 1 VIEW  COMPARISON:  Portable exam 1053 hours compared to 07/11/2014  FINDINGS: Enlargement of cardiac silhouette.  Mediastinal contours and pulmonary vascularity normal.  Interstitial changes peripherally in the mid to lower lungs most consistent with pulmonary fibrosis.  No definite superimposed infiltrate, pleural effusion or edema.  No pneumothorax.  Bones demineralized with BILATERAL glenohumeral degenerative changes.  IMPRESSION: Enlargement of cardiac silhouette.  Pulmonary fibrosis.  No definite acute abnormalities.   Electronically Signed   By: 07/13/2014 M.D.   On: 07/23/2014 11:25    EKG:   Orders placed or performed during the hospital encounter of 07/23/14  . ED EKG  . ED EKG  .  EKG 12-Lead  . EKG  12-Lead    ASSESSMENT AND PLAN:   Septic shock, sepsis and UTI. Clinically better,I will continue cefepime and vancomycin, follow-up CBC, urine culture and blood culture. Continue to  hold hypertension medication due to hypotension. Acute renal failure. I will hold lisinopril,  normal saline IV, follow-up BMP. Elevated troponin. Possibly due to septic shock and acute renal failure, follow-up troponin level and continue aspirin, Plavix and statin. Chronic diastolic CHF. Stable, I will stop Lasix due to septic shock. COPD. Stable. I will continue patient home nebulizer medication.       All the records are reviewed and case discussed with Care Management/Social Workerr. Management plans discussed with the patient, family and they are in agreement.  CODE STATUS: DNR  TOTAL TIME TAKING CARE OF THIS PATIENT: 35 minutes.   POSSIBLE D/C IN 1-2  DAYS, DEPENDING ON CLINICAL CONDITION.   Ramonita Lab M.D on 07/24/2014 at 8:53 PM  Between 7am to 6pm - Pager - [phone] After 6pm go to [email or web address] - password [email or web address] [facility]  [place] [facility]  Office  [phone]  CC: Primary care physician; Rolm Gala, MD

## 2014-07-24 NOTE — Plan of Care (Signed)
Problem: Discharge Progression Outcomes Goal: Other Discharge Outcomes/Goals Outcome: Progressing Pt transferred from CCU. IVF infusing. Afebrile. No c/o pain.

## 2014-07-25 LAB — BASIC METABOLIC PANEL
Anion gap: 6 (ref 5–15)
BUN: 16 mg/dL (ref 6–20)
CALCIUM: 7.7 mg/dL — AB (ref 8.9–10.3)
CO2: 29 mmol/L (ref 22–32)
Chloride: 108 mmol/L (ref 101–111)
Creatinine, Ser: 0.68 mg/dL (ref 0.44–1.00)
GFR calc Af Amer: >60 mL/min (ref >60)
Glucose, Bld: 92 mg/dL (ref 65–99)
Potassium: 3.7 mmol/L (ref 3.5–5.1)
Sodium: 143 mmol/L (ref 135–145)

## 2014-07-25 LAB — GLUCOSE, CAPILLARY
GLUCOSE-CAPILLARY: 107 mg/dL — AB (ref 65–99)
Glucose-Capillary: 79 mg/dL (ref 65–99)
Glucose-Capillary: 163 mg/dL — ABNORMAL HIGH (ref 65–99)
Glucose-Capillary: 88 mg/dL (ref 65–99)

## 2014-07-25 LAB — CBC
HCT: 29.1 % — ABNORMAL LOW (ref 35.0–47.0)
HEMOGLOBIN: 9.5 g/dL — AB (ref 12.0–16.0)
MCH: 29.9 pg (ref 26.0–34.0)
MCHC: 32.8 g/dL (ref 32.0–36.0)
MCV: 91 fL (ref 80.0–100.0)
Platelets: 151 10*3/uL (ref 150–440)
RBC: 3.2 MIL/uL — ABNORMAL LOW (ref 3.80–5.20)
RDW: 16.1 % — ABNORMAL HIGH (ref 11.5–14.5)
WBC: 8.3 10*3/uL (ref 3.6–11.0)

## 2014-07-25 MED ORDER — DEXTROSE 5 % IV SOLN
2.0000 g | INTRAVENOUS | Status: DC
  Administered 2014-07-26 – 2014-07-30 (×5): 2 g via INTRAVENOUS
  Filled 2014-07-25 (×8): qty 2

## 2014-07-25 MED ORDER — FUROSEMIDE 20 MG PO TABS
20.0000 mg | ORAL_TABLET | Freq: Every day | ORAL | Status: DC
  Administered 2014-07-25 – 2014-07-30 (×5): 20 mg via ORAL
  Filled 2014-07-25 (×5): qty 1

## 2014-07-25 MED ORDER — ENOXAPARIN SODIUM 40 MG/0.4ML ~~LOC~~ SOLN
40.0000 mg | SUBCUTANEOUS | Status: DC
  Administered 2014-07-25 – 2014-07-29 (×5): 40 mg via SUBCUTANEOUS
  Filled 2014-07-25 (×5): qty 0.4

## 2014-07-25 NOTE — Plan of Care (Signed)
Problem: Discharge Progression Outcomes Goal: Other Discharge Outcomes/Goals Outcome: Progressing Afebrile. BP/HR stable. IVF infusing. Voids without difficulty. ABX.Denies pain.

## 2014-07-25 NOTE — Plan of Care (Signed)
Problem: Discharge Progression Outcomes Goal: Other Discharge Outcomes/Goals Outcome: Progressing Pt progressing, well. PT to work with pt and possible back to assisted living facility 1-2 days. Received IV abts and IV fluids discontinued. VSS, no fever noted.

## 2014-07-25 NOTE — Progress Notes (Addendum)
ANTIBIOTIC CONSULT NOTE - FOLLOW UP  Pharmacy Consult for Cefepime Dosing Indication: Sepsis/UTI  Allergies  Allergen Reactions  . Aspirin Shortness Of Breath  . Cephalosporins Other (See Comments)    Difficulty walking  . Levaquin [Levofloxacin In D5w] Other (See Comments)    GI Distress, Cramps  . Sulfa Antibiotics Other (See Comments)    unknown    Patient Measurements:   Vital Signs: Temp: 98.3 F (36.8 C) (05/12 1339) Temp Source: Oral (05/12 1339) BP: 104/45 mmHg (05/12 1339) Pulse Rate: 122 (05/12 1700) Intake/Output from previous day: 05/11 0701 - 05/12 0700 In: 1470 [I.V.:1270; IV Piggyback:200] Out: 500 [Urine:500] Intake/Output from this shift: Total I/O In: -  Out: 200 [Urine:200]  Labs:  Recent Labs  07/23/14 1643 07/24/14 0330 07/25/14 0501  WBC 9.1 8.6 8.3  HGB 10.7* 9.9* 9.5*  PLT 157 151 151  CREATININE 1.42* 0.96 0.68   Estimated Creatinine Clearance: 44.7 mL/min (by C-G formula based on Cr of 0.68). No results for input(s): VANCOTROUGH, VANCOPEAK, VANCORANDOM, GENTTROUGH, GENTPEAK, GENTRANDOM, TOBRATROUGH, TOBRAPEAK, TOBRARND, AMIKACINPEAK, AMIKACINTROU, AMIKACIN in the last 72 hours.   Microbiology: Recent Results (from the past 720 hour(s))  Culture, blood (single)     Status: None (Preliminary result)   Collection Time: 07/11/14 10:34 AM  Result Value Ref Range Status   Micro Text Report   Preliminary       COMMENT                   NO GROWTH IN 48 HOURS   ANTIBIOTIC                                                      Culture, blood (single)     Status: None (Preliminary result)   Collection Time: 07/11/14 10:48 AM  Result Value Ref Range Status   Micro Text Report   Preliminary       COMMENT                   NO GROWTH IN 48 HOURS   ANTIBIOTIC                                                      Blood Culture (routine x 2)     Status: None (Preliminary result)   Collection Time: 07/23/14 11:25 AM  Result Value Ref Range  Status   Specimen Description BLOOD  Final   Special Requests BLOOD  Final   Culture NO GROWTH 2 DAYS  Final   Report Status PENDING  Incomplete  Blood Culture (routine x 2)     Status: None (Preliminary result)   Collection Time: 07/23/14 11:45 AM  Result Value Ref Range Status   Specimen Description BLOOD  Final   Special Requests BLOOD  Final   Culture NO GROWTH 2 DAYS  Final   Report Status PENDING  Incomplete  Urine culture     Status: None (Preliminary result)   Collection Time: 07/23/14 12:15 PM  Result Value Ref Range Status   Specimen Description URINE, RANDOM  Final   Special Requests URINE, RANDOM  Final   Culture   Final    >=  100,000 COLONIES/mL GRAM NEGATIVE RODS 30,000 COLONIES/ml GRAM NEGATIVE RODS IDENTIFICATION AND SUSCEPTIBILITIES TO FOLLOW    Report Status PENDING  Incomplete  Influenza A&B Antigens [facility])     Status: None   Collection Time: 07/23/14 12:25 PM  Result Value Ref Range Status   Influenza A [facility]) NOT DETECTED  Final   Influenza B ([facility]) NOT DETECTED  Final    Anti-infectives    Start     Dose/Rate Route Frequency Ordered Stop   07/24/14 1000  ceFEPIme (MAXIPIME) 1 g in dextrose 5 % 50 mL IVPB     1 g 100 mL/hr over 30 Minutes Intravenous Every 24 hours 07/23/14 1652     07/23/14 2100  vancomycin (VANCOCIN) IVPB 1000 mg/200 mL premix  Status:  Discontinued     1,000 mg 200 mL/hr over 60 Minutes Intravenous Every 24 hours 07/23/14 1652 07/25/14 1552   07/23/14 1115  ceFEPIme (MAXIPIME) 2 g in dextrose 5 % 50 mL IVPB     2 g 100 mL/hr over 30 Minutes Intravenous  Once 07/23/14 1111 07/23/14 1330   07/23/14 1115  vancomycin (VANCOCIN) IVPB 1000 mg/200 mL premix     1,000 mg 200 mL/hr over 60 Minutes Intravenous  Once 07/23/14 1111 07/23/14 1330      Assessment: 81 yo female being treated for Sepsis/UTI. Urine Culture growing Proteus and Enterococcus. Per rounds today narrowed therapy to just cefepime and discontinued vancomycin.    Plan:   Continue cefepime 2g IV q24hr. Will follow cultures and narrow as appropriate.    Pharmacy will continue to monitor and adjust per consult.    Kalep Full L 07/25/2014,8:49 PM

## 2014-07-25 NOTE — Plan of Care (Signed)
Problem: Discharge Progression Outcomes Goal: Discharge plan in place and appropriate Individualization:  Outcome: Progressing Pt is high fall risk- Offer toileting qx1hr with safety checks. Pt uses the bedpen with 1xassist. Incontinent at times. H/o CHF, COPD, anxiety, HTN controlled with meds.

## 2014-07-25 NOTE — Progress Notes (Signed)
[facility] - [place] at [facility]   PATIENT NAME: Denise Duke    MR#:  [id number]  DATE OF BIRTH:  03-Sep-1927  SUBJECTIVE:  CHIEF COMPLAINT:  Pt is awake and alert , feels better, tolerating PO diet. No new complaints  REVIEW OF SYSTEMS:  CONSTITUTIONAL: No fever, fatigue or weakness.  EYES: No blurred or double vision.  EARS, NOSE, AND THROAT: No tinnitus or ear pain.  RESPIRATORY: No cough, shortness of breath, wheezing or hemoptysis.  CARDIOVASCULAR: No chest pain, orthopnea, edema.  GASTROINTESTINAL: No nausea, vomiting, diarrhea or abdominal pain.  GENITOURINARY: No dysuria, hematuria.  ENDOCRINE: No polyuria, nocturia,  HEMATOLOGY: No anemia, easy bruising or bleeding SKIN: No rash or lesion. MUSCULOSKELETAL: No joint pain or arthritis.   NEUROLOGIC: No tingling, numbness, weakness.  PSYCHIATRY: No anxiety or depression.   DRUG ALLERGIES:   Allergies  Allergen Reactions  . Aspirin Shortness Of Breath  . Cephalosporins Other (See Comments)    Difficulty walking  . Levaquin [Levofloxacin In D5w] Other (See Comments)    GI Distress, Cramps  . Sulfa Antibiotics Other (See Comments)    unknown    VITALS:  Blood pressure 104/45, pulse 122, temperature 98.3 F (36.8 C), temperature source Oral, resp. rate 18, height 4\' 9"  (1.448 m), weight 79.8 kg (175 lb 14.8 oz), SpO2 95 %.  PHYSICAL EXAMINATION:  GENERAL:  81 y.o.-year-old patient lying in the bed with no acute distress.  EYES: Pupils equal, round, reactive to light and accommodation. No scleral icterus. Extraocular muscles intact.  HEENT: Head atraumatic, normocephalic. Oropharynx and nasopharynx clear.  NECK:  Supple, no jugular venous distention. No thyroid enlargement, no tenderness.  LUNGS: Normal breath sounds bilaterally, no wheezing, rales,rhonchi or crepitation. No use of accessory muscles of respiration.  CARDIOVASCULAR: S1, S2 normal. No murmurs, rubs, or gallops.  ABDOMEN:  Soft, nontender, nondistended. Bowel sounds present. No organomegaly or mass.  EXTREMITIES: No pedal edema, cyanosis, or clubbing.  NEUROLOGIC: Cranial nerves II through XII are intact. Muscle strength 5/5 in all extremities. Sensation intact. Gait not checked.  PSYCHIATRIC: The patient is alert and oriented x 3.  SKIN: No obvious rash, lesion, or ulcer.    LABORATORY PANEL:   CBC  Recent Labs Lab 07/25/14 0501  WBC 8.3  HGB 9.5*  HCT 29.1*  PLT 151   ------------------------------------------------------------------------------------------------------------------  Chemistries   Recent Labs Lab 07/24/14 0330 07/25/14 0501  NA 139 143  K 3.7 3.7  CL 103 108  CO2 27 29  GLUCOSE 158* 92  BUN 25* 16  CREATININE 0.96 0.68  CALCIUM 7.9* 7.7*  MG 1.7  --    ------------------------------------------------------------------------------------------------------------------  Cardiac Enzymes  Recent Labs Lab 07/23/14 1643  TROPONINI 0.16*   ------------------------------------------------------------------------------------------------------------------  RADIOLOGY:  No results found.  EKG:   Orders placed or performed during the hospital encounter of 07/23/14  . ED EKG  . ED EKG  . EKG 12-Lead  . EKG 12-Lead    ASSESSMENT AND PLAN:   Septic shock, sepsis and UTI 2/2  Proteus and Klebsiella Clinically better,I will continue cefepime and d/c vancomycin, follow-up CBC, urine culture and blood culture. Continue to  hold hypertension medication due to hypotension. Acute renal failure. Resolved. I will hold lisinopril, d/c  normal saline IV. Elevated troponin. Possibly due to septic shock and acute renal failure, follow-up troponin level is not trending and continue aspirin, Plavix and statin. Chronic diastolic CHF. Stable,  resume Lasix  COPD. Stable.  continue patient home nebulizer medication.  Gen weakness- PT eval     All the records are reviewed and case  discussed with Care Management/Social Workerr. Management plans discussed with the patient, family and they are in agreement.  CODE STATUS: DNR  TOTAL TIME TAKING CARE OF THIS PATIENT: 35 minutes.   POSSIBLE D/C IN 1-2  DAYS, DEPENDING ON CLINICAL CONDITION.   Ramonita Lab M.D on 07/25/2014 at 7:45 PM  Between 7am to 6pm - Pager - [phone] After 6pm go to [email or web address] - password [email or web address] [facility]  [place] [facility]  Office  [phone]  CC: Primary care physician; Rolm Gala, MD

## 2014-07-26 ENCOUNTER — Other Ambulatory Visit: Payer: Self-pay

## 2014-07-26 ENCOUNTER — Inpatient Hospital Stay: Payer: Medicare Other

## 2014-07-26 LAB — BASIC METABOLIC PANEL
ANION GAP: 9 (ref 5–15)
BUN: 15 mg/dL (ref 6–20)
CALCIUM: 8 mg/dL — AB (ref 8.9–10.3)
CO2: 32 mmol/L (ref 22–32)
CREATININE: 0.89 mg/dL (ref 0.44–1.00)
Chloride: 96 mmol/L — ABNORMAL LOW (ref 101–111)
GFR calc Af Amer: >60 mL/min (ref >60)
GFR calc non Af Amer: 57 mL/min — ABNORMAL LOW (ref >60)
Glucose, Bld: 84 mg/dL (ref 65–99)
POTASSIUM: 3.7 mmol/L (ref 3.5–5.1)
Sodium: 137 mmol/L (ref 135–145)

## 2014-07-26 LAB — GLUCOSE, CAPILLARY
GLUCOSE-CAPILLARY: 77 mg/dL (ref 65–99)
Glucose-Capillary: 88 mg/dL (ref 65–99)
Glucose-Capillary: 124 mg/dL — ABNORMAL HIGH (ref 65–99)
Glucose-Capillary: 144 mg/dL — ABNORMAL HIGH (ref 65–99)
Glucose-Capillary: 99 mg/dL (ref 65–99)
Glucose-Capillary: 153 mg/dL — ABNORMAL HIGH (ref 65–99)

## 2014-07-26 LAB — CBC
HCT: 35.6 % (ref 35.0–47.0)
Hemoglobin: 11.3 g/dL — ABNORMAL LOW (ref 12.0–16.0)
MCH: 29.2 pg (ref 26.0–34.0)
MCHC: 31.8 g/dL — ABNORMAL LOW (ref 32.0–36.0)
MCV: 91.9 fL (ref 80.0–100.0)
Platelets: 175 10*3/uL (ref 150–440)
RBC: 3.87 MIL/uL (ref 3.80–5.20)
RDW: 16.3 % — ABNORMAL HIGH (ref 11.5–14.5)
WBC: 8.3 10*3/uL (ref 3.6–11.0)

## 2014-07-26 LAB — TROPONIN I
Troponin I: 0.11 ng/mL — ABNORMAL HIGH (ref <0.031)
Troponin I: 0.1 ng/mL — ABNORMAL HIGH (ref <0.031)

## 2014-07-26 MED ORDER — DIGOXIN 0.25 MG/ML IJ SOLN
0.2500 mg | Freq: Once | INTRAMUSCULAR | Status: AC
  Administered 2014-07-26: 0.5 mg via INTRAVENOUS
  Filled 2014-07-26: qty 1

## 2014-07-26 MED ORDER — VANCOMYCIN HCL IN DEXTROSE 1-5 GM/200ML-% IV SOLN
1000.0000 mg | Freq: Two times a day (BID) | INTRAVENOUS | Status: DC
Start: 2014-07-26 — End: 2014-07-28
  Administered 2014-07-26 – 2014-07-28 (×4): 1000 mg via INTRAVENOUS
  Filled 2014-07-26 (×11): qty 200

## 2014-07-26 MED ORDER — DILTIAZEM HCL 60 MG PO TABS
30.0000 mg | ORAL_TABLET | Freq: Four times a day (QID) | ORAL | Status: DC
  Administered 2014-07-26 – 2014-07-27 (×5): 30 mg via ORAL
  Administered 2014-07-28: 60 mg via ORAL
  Administered 2014-07-28: 19:00:00 30 mg via ORAL
  Administered 2014-07-28 – 2014-07-29 (×4): 60 mg via ORAL
  Filled 2014-07-26 (×11): qty 1

## 2014-07-26 MED ORDER — DIGOXIN 0.25 MG/ML IJ SOLN
0.2500 mg | Freq: Once | INTRAMUSCULAR | Status: AC
  Administered 2014-07-26: 11:00:00 0.5 mg via INTRAVENOUS
  Filled 2014-07-26 (×2): qty 1

## 2014-07-26 MED ORDER — INSULIN ASPART 100 UNIT/ML ~~LOC~~ SOLN
0.0000 [IU] | Freq: Three times a day (TID) | SUBCUTANEOUS | Status: DC
  Administered 2014-07-27: 13:00:00 5 [IU] via SUBCUTANEOUS
  Administered 2014-07-27: 2 [IU] via SUBCUTANEOUS
  Administered 2014-07-27: 5 [IU] via SUBCUTANEOUS
  Administered 2014-07-28: 13:00:00 9 [IU] via SUBCUTANEOUS
  Administered 2014-07-28 (×2): 2 [IU] via SUBCUTANEOUS
  Administered 2014-07-29: 13:00:00 5 [IU] via SUBCUTANEOUS
  Administered 2014-07-29 (×2): 2 [IU] via SUBCUTANEOUS
  Administered 2014-07-30: 12:00:00 3 [IU] via SUBCUTANEOUS
  Administered 2014-07-30: 08:00:00 2 [IU] via SUBCUTANEOUS
  Filled 2014-07-26: qty 2
  Filled 2014-07-26: qty 5
  Filled 2014-07-26: qty 2
  Filled 2014-07-26: qty 9
  Filled 2014-07-26: qty 2
  Filled 2014-07-26: qty 5
  Filled 2014-07-26 (×3): qty 2
  Filled 2014-07-26: qty 5

## 2014-07-26 MED ORDER — IPRATROPIUM-ALBUTEROL 0.5-2.5 (3) MG/3ML IN SOLN: 3.0000 mL | Freq: Four times a day (QID) | RESPIRATORY_TRACT | Status: DC | PRN

## 2014-07-26 MED ORDER — DIGOXIN 250 MCG PO TABS
0.2500 mg | ORAL_TABLET | Freq: Two times a day (BID) | ORAL | Status: DC
  Administered 2014-07-26 – 2014-07-28 (×5): 0.25 mg via ORAL
  Filled 2014-07-26 (×6): qty 1

## 2014-07-26 MED ORDER — METHYLPREDNISOLONE SODIUM SUCC 125 MG IJ SOLR
60.0000 mg | Freq: Four times a day (QID) | INTRAMUSCULAR | Status: DC
  Administered 2014-07-26: 60 mg via INTRAVENOUS
  Administered 2014-07-27: 21:00:00 via INTRAVENOUS
  Administered 2014-07-27 – 2014-07-28 (×4): 60 mg via INTRAVENOUS
  Administered 2014-07-28: 13:00:00 125 mg via INTRAVENOUS
  Administered 2014-07-28 (×2): 60 mg via INTRAVENOUS
  Administered 2014-07-29: 125 mg via INTRAVENOUS
  Filled 2014-07-26 (×11): qty 2

## 2014-07-26 MED ORDER — METHYLPREDNISOLONE SODIUM SUCC 125 MG IJ SOLR
125.0000 mg | Freq: Once | INTRAMUSCULAR | Status: AC
  Administered 2014-07-26: 125 mg via INTRAVENOUS
  Filled 2014-07-26: qty 2

## 2014-07-26 NOTE — Progress Notes (Signed)
PT Cancellation Note  Patient Details Name: Denise Duke MRN: [id number] DOB: 28-Feb-1928   Cancelled Treatment:    Reason Eval/Treat Not Completed: Medical issues which prohibited therapy (Nursing reporting pt transferring to CCU soon d/t medical issues).  Will hold PT at this time.  PT will require new orders in order to initiate PT eval.   Hendricks Limes 07/26/2014, 11:28 AM Hendricks Limes, PT [phone]

## 2014-07-26 NOTE — Treatment Plan (Signed)
Pt alert and being transferred to CCU, Sheralyn Boatman daughter called but no answer. Report given to [place] in CCU, pt transported to CCU 17, stable upon arrival.

## 2014-07-26 NOTE — Progress Notes (Signed)
[facility] - [place] at [facility]   PATIENT NAME: Denise Duke    MR#:  [id number]  DATE OF BIRTH:  Mar 10, 1928  SUBJECTIVE:  CHIEF COMPLAINT:  Pt is febrile today morning. Patient went into atrial fibrillation with RVR and anxious. Given digoxin as patient was hypotensive with no significant improvement. After giving 2 doses of digoxin IV patient was moved to intensive care unit as there is no significant improvement in her heart rate . Patient denies any chest pain during my examination REVIEW OF SYSTEMS:  CONSTITUTIONAL: No fever, fatigue or weakness.  EYES: No blurred or double vision.  EARS, NOSE, AND THROAT: No tinnitus or ear pain.  RESPIRATORY: No cough, shortness of breath, wheezing or hemoptysis.  CARDIOVASCULAR: No chest pain, orthopnea, edema.  GASTROINTESTINAL: No nausea, vomiting, diarrhea or abdominal pain.  GENITOURINARY: No dysuria, hematuria.  ENDOCRINE: No polyuria, nocturia,  HEMATOLOGY: No anemia, easy bruising or bleeding SKIN: No rash or lesion. MUSCULOSKELETAL: No joint pain or arthritis.   NEUROLOGIC: No tingling, numbness, weakness.  PSYCHIATRY: No anxiety or depression.   DRUG ALLERGIES:   Allergies  Allergen Reactions  . Aspirin Shortness Of Breath  . Cephalosporins Other (See Comments)    Difficulty walking  . Levaquin [Levofloxacin In D5w] Other (See Comments)    GI Distress, Cramps  . Sulfa Antibiotics Other (See Comments)    unknown    VITALS:  Blood pressure 101/58, pulse 120, temperature 98.7 F (37.1 C), temperature source Oral, resp. rate 24, height 4\' 9"  (1.448 m), weight 79.8 kg (175 lb 14.8 oz), SpO2 95 %.  PHYSICAL EXAMINATION:  GENERAL:  81 y.o.-year-old patient lying in the bed with no acute distress.  EYES: Pupils equal, round, reactive to light and accommodation. No scleral icterus. Extraocular muscles intact.  HEENT: Head atraumatic, normocephalic. Oropharynx and nasopharynx clear.  NECK:  Supple, no  jugular venous distention. No thyroid enlargement, no tenderness.  LUNGS: Moderate breath sounds, positive crackles. Diffuse wheezing is present. No use of accessory muscles of respiration.  CARDIOVASCULAR: Irregularly irregular, no rubs, or gallops.  ABDOMEN: Soft, nontender, nondistended. Bowel sounds present. No organomegaly or mass.  EXTREMITIES: No pedal edema, cyanosis, or clubbing.  NEUROLOGIC: Cranial nerves II through XII are intact. Muscle strength 5/5 in all extremities. Sensation intact. Gait not checked.  PSYCHIATRIC: The patient is alert and oriented x 3.  SKIN: No obvious rash, lesion, or ulcer.    LABORATORY PANEL:   CBC  Recent Labs Lab 07/26/14 0936  WBC 8.3  HGB 11.3*  HCT 35.6  PLT 175   ------------------------------------------------------------------------------------------------------------------  Chemistries   Recent Labs Lab 07/24/14 0330  07/26/14 0936  NA 139  < > 137  K 3.7  < > 3.7  CL 103  < > 96*  CO2 27  < > 32  GLUCOSE 158*  < > 84  BUN 25*  < > 15  CREATININE 0.96  < > 0.89  CALCIUM 7.9*  < > 8.0*  MG 1.7  --   --   < > = values in this interval not displayed. ------------------------------------------------------------------------------------------------------------------  Cardiac Enzymes  Recent Labs Lab 07/26/14 1316  TROPONINI 0.10*   ------------------------------------------------------------------------------------------------------------------  RADIOLOGY:  Dg Chest 1 View  07/26/2014   CLINICAL DATA:  Shortness of breath  EXAM: CHEST  1 VIEW  COMPARISON:  07/23/2014 and previous  FINDINGS: Cardiac enlargement again noted. Pulmonary fibrosis again noted. Density slightly more pronounced at the lung bases consistent with atelectasis or mild superimposed basilar  pneumonia.  IMPRESSION: Cardiomegaly. Background pulmonary fibrosis. Slight worsening of density at the bases that could represent atelectasis or mild pneumonia.    Electronically Signed   By: Paulina Fusi M.D.   On: 07/26/2014 10:01    EKG:   Orders placed or performed during the hospital encounter of 07/23/14  . ED EKG  . ED EKG  . EKG 12-Lead  . EKG 12-Lead  . EKG 12-Lead  . EKG 12-Lead    ASSESSMENT AND PLAN:   Acute respiratory distress secondary to healthcare associated pneumonia, acute exacerbation of COPD, new onset atrial fibrillation with RVR  New-onset atrial fibrillation with RVR- given 2 doses of IV digoxin with no significant improvement in her heart rate. Patient is hypotensive, more her to intensive care unit for close monitoring. Patient is started on by mouth digoxin 0.25 mg by mouth twice a day. Patient is on Coreg will continue the same. We will cycle cardiac biomarkers. Cardiac consult is placed to Dr. Vennie Homans.  Healthcare associated pneumonia-we will continue IV cefepime and vancomycin is added to the regimen.  Acute exacerbation of COPD salmeterol 120 family was IV will be given and will continue centimeters 60 mg IV every 6 hours and inability treatments as needed basis.  Septic shock, sepsis and UTI 2/2  Proteus and Klebsiella Clinically better,I will continue cefepime and d/c vancomycin, follow-up CBC, urine culture and blood culture. Continue to  hold hypertension medication due to hypotension. Acute renal failure. Resolved. I will hold lisinopril, d/c  normal saline IV. Elevated troponin. Possibly due to septic shock and acute renal failure, follow-up troponin level is not trending and continue aspirin, Plavix and statin. Chronic diastolic CHF. Stable,  resume Lasix   Gen weakness- PT eval     All the records are reviewed and case discussed with Care Management/Social Workerr. Management plans discussed with the patient, family and they are in agreement.  CODE STATUS: DNR  TOTAL  critical care time-45 minutes POSSIBLE D/C IN 1-2  DAYS, DEPENDING ON CLINICAL CONDITION.   Ramonita Lab M.D on 07/26/2014 at  4:46 PM  Between 7am to 6pm - Pager - [phone] After 6pm go to [email or web address] - password [email or web address] [facility]  [place] [facility]  Office  [phone]  CC: Primary care physician; Rolm Gala, MD

## 2014-07-26 NOTE — Plan of Care (Signed)
Problem: Discharge Progression Outcomes Goal: Other Discharge Outcomes/Goals Plan of care progress to goal:  Afebrile. Patient continues on 1L-O2. IV antibiotics. PO Lasix. Possible [facility] to [facility] 5/13.

## 2014-07-26 NOTE — Treatment Plan (Signed)
Intensive Care Nurse, Robyne Askew, RN notified of patient condition. Present in room.

## 2014-07-26 NOTE — Progress Notes (Signed)
Pt will transfer to CCU per Dr's order, dig given x2 with no noted relief. Hr in the 120's to 150's with 2 episodes of Vtack ( 6 beat run) BP low 90's over 50's or lower. Attempted to call granddaughter (POA,) but no answer. Temperature this AM and decreased with tylenol. New IV started.

## 2014-07-26 NOTE — Progress Notes (Signed)
Contacted Dr. Mervyn Skeeters. Gouru. Discussed heart rate, 140 S.T., vital signs, patient diaphoretic, short of breath. Requested MD to assess patient.

## 2014-07-26 NOTE — Treatment Plan (Signed)
Bryson Ha, RN and I remained at patient's bedside while patient in acute distress. Kristi remains at bedside.

## 2014-07-26 NOTE — Treatment Plan (Signed)
Dr. Elveria Royals to room, patient assessed by MD.

## 2014-07-26 NOTE — Consult Note (Addendum)
Reason for Consult: Tachycardia shortness of breath and elevated troponin Referring Physician: Dr. Barbette Hair Denise Duke is an 81 y.o. female.  HPI: 81 year old white female recently admitted with congestive heart failure and COPD hypertension TIA found to have urosepsis treated with antibiotics now complains of generalized weakness shortness of breath dyspnea. Patient complains of significant shortness of breath at rest earlier in admission she was treated for hypotension and found to be septic placed on broad-spectrum antibiotic therapy. Patient now complains of significant dyspnea at rest she is on inhalers denies any chest pain is not certain about tachycardia but EKG suggests heart rates of close to 120. I don't see any clear evidence of atrial fibrillationsinus tach with PACs and short runs of SVT she's also demonstrated nonsustained ventricular tachycardia. Denies any anginal symptoms being transported to intensive care unit for further management of her persistent shortness of breath.  Past Medical History  Diagnosis Date  . CHF (congestive heart failure)   . TIA (transient ischemic attack)   . Anxiety   . COPD (chronic obstructive pulmonary disease)   . Hypertension     History reviewed. No pertinent past surgical history.  History reviewed. No pertinent family history.  Social History:  reports that she has never smoked. She does not have any smokeless tobacco history on file. She reports that she does not drink alcohol or use illicit drugs.  Allergies:  Allergies  Allergen Reactions  . Aspirin Shortness Of Breath  . Cephalosporins Other (See Comments)    Difficulty walking  . Levaquin [Levofloxacin In D5w] Other (See Comments)    GI Distress, Cramps  . Sulfa Antibiotics Other (See Comments)    unknown    Medications: I have reviewed the patient's current medications.  Results for orders placed or performed during the hospital encounter of 07/23/14 (from the past 48  hour(s))  Glucose, capillary     Status: None   Collection Time: 07/24/14  4:37 PM  Result Value Ref Range   Glucose-Capillary 95 70 - 99 mg/dL  Glucose, capillary     Status: Abnormal   Collection Time: 07/24/14 10:06 PM  Result Value Ref Range   Glucose-Capillary 187 (H) 70 - 99 mg/dL  CBC     Status: Abnormal   Collection Time: 07/25/14  5:01 AM  Result Value Ref Range   WBC 8.3 3.6 - 11.0 K/uL   RBC 3.20 (L) 3.80 - 5.20 MIL/uL   Hemoglobin 9.5 (L) 12.0 - 16.0 g/dL   HCT 29.1 (L) 35.0 - 47.0 %   MCV 91.0 80.0 - 100.0 fL   MCH 29.9 26.0 - 34.0 pg   MCHC 32.8 32.0 - 36.0 g/dL   RDW 16.1 (H) 11.5 - 14.5 %   Platelets 151 150 - 440 K/uL  Basic metabolic panel     Status: Abnormal   Collection Time: 07/25/14  5:01 AM  Result Value Ref Range   Sodium 143 135 - 145 mmol/L   Potassium 3.7 3.5 - 5.1 mmol/L   Chloride 108 101 - 111 mmol/L   CO2 29 22 - 32 mmol/L   Glucose, Bld 92 65 - 99 mg/dL   BUN 16 6 - 20 mg/dL   Creatinine, Ser 0.68 0.44 - 1.00 mg/dL   Calcium 7.7 (L) 8.9 - 10.3 mg/dL   GFR calc non Af Amer >60 >60 mL/min   GFR calc Af Amer >60 >60 mL/min    Comment: (NOTE) The eGFR has been calculated using the CKD EPI equation.  This calculation has not been validated in all clinical situations. eGFR's persistently <60 mL/min signify possible Chronic Kidney Disease.    Anion gap 6 5 - 15  Glucose, capillary     Status: None   Collection Time: 07/25/14  7:09 AM  Result Value Ref Range   Glucose-Capillary 79 65 - 99 mg/dL  Glucose, capillary     Status: Abnormal   Collection Time: 07/25/14 11:15 AM  Result Value Ref Range   Glucose-Capillary 163 (H) 65 - 99 mg/dL  Glucose, capillary     Status: Abnormal   Collection Time: 07/25/14  5:31 PM  Result Value Ref Range   Glucose-Capillary 107 (H) 65 - 99 mg/dL  Glucose, capillary     Status: None   Collection Time: 07/25/14 10:03 PM  Result Value Ref Range   Glucose-Capillary 88 65 - 99 mg/dL   Comment 1 Notify RN    Glucose, capillary     Status: None   Collection Time: 07/26/14  7:21 AM  Result Value Ref Range   Glucose-Capillary 88 65 - 99 mg/dL  Glucose, capillary     Status: None   Collection Time: 07/26/14  9:10 AM  Result Value Ref Range   Glucose-Capillary 77 65 - 99 mg/dL  Troponin I     Status: Abnormal   Collection Time: 07/26/14  9:36 AM  Result Value Ref Range   Troponin I 0.11 (H) <0.031 ng/mL    Comment: [place] WITH JEREMIAH KEENE AT 0092 ON 07/26/14.Marland KitchenMarland KitchenCrivitz        PERSISTENTLY INCREASED TROPONIN VALUES IN THE RANGE OF 0.04-0.49 ng/mL CAN BE SEEN IN:       -UNSTABLE ANGINA       -CONGESTIVE HEART FAILURE       -MYOCARDITIS       -CHEST TRAUMA       -ARRYHTHMIAS       -LATE PRESENTING MYOCARDIAL INFARCTION       -COPD   CLINICAL FOLLOW-UP RECOMMENDED.   CBC     Status: Abnormal   Collection Time: 07/26/14  9:36 AM  Result Value Ref Range   WBC 8.3 3.6 - 11.0 K/uL   RBC 3.87 3.80 - 5.20 MIL/uL   Hemoglobin 11.3 (L) 12.0 - 16.0 g/dL   HCT 35.6 35.0 - 47.0 %   MCV 91.9 80.0 - 100.0 fL   MCH 29.2 26.0 - 34.0 pg   MCHC 31.8 (L) 32.0 - 36.0 g/dL   RDW 16.3 (H) 11.5 - 14.5 %   Platelets 175 150 - 440 K/uL  Basic metabolic panel     Status: Abnormal   Collection Time: 07/26/14  9:36 AM  Result Value Ref Range   Sodium 137 135 - 145 mmol/L   Potassium 3.7 3.5 - 5.1 mmol/L   Chloride 96 (L) 101 - 111 mmol/L   CO2 32 22 - 32 mmol/L   Glucose, Bld 84 65 - 99 mg/dL   BUN 15 6 - 20 mg/dL   Creatinine, Ser 0.89 0.44 - 1.00 mg/dL   Calcium 8.0 (L) 8.9 - 10.3 mg/dL   GFR calc non Af Amer 57 (L) >60 mL/min   GFR calc Af Amer >60 >60 mL/min    Comment: (NOTE) The eGFR has been calculated using the CKD EPI equation. This calculation has not been validated in all clinical situations. eGFR's persistently <60 mL/min signify possible Chronic Kidney Disease.    Anion gap 9 5 - 15  Glucose, capillary  Status: Abnormal   Collection Time: 07/26/14 11:13 AM  Result  Value Ref Range   Glucose-Capillary 124 (H) 65 - 99 mg/dL  Glucose, capillary     Status: Abnormal   Collection Time: 07/26/14 12:41 PM  Result Value Ref Range   Glucose-Capillary 144 (H) 65 - 99 mg/dL    Dg Chest 1 View  07/26/2014   CLINICAL DATA:  Shortness of breath  EXAM: CHEST  1 VIEW  COMPARISON:  07/23/2014 and previous  FINDINGS: Cardiac enlargement again noted. Pulmonary fibrosis again noted. Density slightly more pronounced at the lung bases consistent with atelectasis or mild superimposed basilar pneumonia.  IMPRESSION: Cardiomegaly. Background pulmonary fibrosis. Slight worsening of density at the bases that could represent atelectasis or mild pneumonia.   Electronically Signed   By: Nelson Chimes M.D.   On: 07/26/2014 10:01    Review of Systems  Constitutional: Positive for malaise/fatigue.  Eyes: Negative.   Respiratory: Positive for shortness of breath and wheezing.   Cardiovascular: Positive for palpitations, orthopnea, leg swelling and PND.  Gastrointestinal: Negative.   Genitourinary: Negative.   Musculoskeletal: Negative.   Skin: Negative.   Neurological: Positive for weakness.  Endo/Heme/Allergies: Negative.   Psychiatric/Behavioral: Negative.    Blood pressure 101/58, pulse 120, temperature 98.7 F (37.1 C), temperature source Oral, resp. rate 24, height 4' 9"  (1.448 m), weight 79.8 kg (175 lb 14.8 oz), SpO2 95 %. Physical Exam  Constitutional: She is oriented to person, place, and time. She appears well-developed and well-nourished. She appears distressed.  HENT:  Head: Normocephalic.  Eyes: Conjunctivae and EOM are normal. Pupils are equal, round, and reactive to light.  Neck: Normal range of motion. Neck supple.  Cardiovascular:  Murmur heard. Tachycardic  Respiratory: She is in respiratory distress. She has wheezes.  GI: Soft.  Musculoskeletal: Normal range of motion.  Neurological: She is alert and oriented to person, place, and time.  Skin: Skin is  warm and dry.  Psychiatric: She has a normal mood and affect.    Assessment/Plan: Shortness of breath Congestive heart failure COPD Tachycardia Wheezing Hypertension Anxiety Leg edema Nonsustained ventricular tachycardia History of TIA Hyperlipidemia Chronically elevated troponins UTI with recent sepsis . Plan Refer tachycardiac consider increasing Coreg Consider low-dose digoxin for tachycardia Recommend steroid therapy with inhalers Low-dose Lasix as needed Hypertension control Continue Lipitor for lipid management Consider benzodiazepines for anxiety Okay to continue Imdur for possible anginal equivalent Supplemental oxygen as needed for shortness of breath No clear evidence of acute heart failure at this stage Consider pulmonary input for COPD with exacerbation Continue broad-spectrum antibiotics for UTI and sepsis  Emilygrace Grothe D. 07/26/2014, 1:33 PM

## 2014-07-27 LAB — CBC
HCT: 34.2 % — ABNORMAL LOW (ref 35.0–47.0)
Hemoglobin: 11.3 g/dL — ABNORMAL LOW (ref 12.0–16.0)
MCH: 30.3 pg (ref 26.0–34.0)
MCHC: 33.1 g/dL (ref 32.0–36.0)
MCV: 91.5 fL (ref 80.0–100.0)
Platelets: 151 10*3/uL (ref 150–440)
RBC: 3.74 MIL/uL — AB (ref 3.80–5.20)
RDW: 16.9 % — ABNORMAL HIGH (ref 11.5–14.5)
WBC: 6.6 10*3/uL (ref 3.6–11.0)

## 2014-07-27 LAB — BASIC METABOLIC PANEL
Anion gap: 7 (ref 5–15)
BUN: 19 mg/dL (ref 6–20)
CHLORIDE: 101 mmol/L (ref 101–111)
CO2: 31 mmol/L (ref 22–32)
Calcium: 8.6 mg/dL — ABNORMAL LOW (ref 8.9–10.3)
Creatinine, Ser: 0.76 mg/dL (ref 0.44–1.00)
GFR calc Af Amer: >60 mL/min (ref >60)
GFR calc non Af Amer: >60 mL/min (ref >60)
Glucose, Bld: 206 mg/dL — ABNORMAL HIGH (ref 65–99)
Potassium: 4.2 mmol/L (ref 3.5–5.1)
SODIUM: 139 mmol/L (ref 135–145)

## 2014-07-27 LAB — URINE CULTURE

## 2014-07-27 LAB — GLUCOSE, CAPILLARY
Glucose-Capillary: 158 mg/dL — ABNORMAL HIGH (ref 65–99)
Glucose-Capillary: 273 mg/dL — ABNORMAL HIGH (ref 65–99)
Glucose-Capillary: 267 mg/dL — ABNORMAL HIGH (ref 65–99)
Glucose-Capillary: 182 mg/dL — ABNORMAL HIGH (ref 65–99)

## 2014-07-27 NOTE — Progress Notes (Signed)
[facility] - [place] at [facility]   PATIENT NAME: Denise Duke    MR#:  [id number]  DATE OF BIRTH:  1927-08-16  SUBJECTIVE:  CHIEF COMPLAINT:  Pt is afebrile today , HR is better controled, now, in 80-90. , moved out from intensive care unit as there was improvement in her heart rate . Patient denies any chest pain during my examination, couhjs, white sputum  REVIEW OF SYSTEMS:  CONSTITUTIONAL: No fever, fatigue or weakness.  EYES: No blurred or double vision.  EARS, NOSE, AND THROAT: No tinnitus or ear pain.  RESPIRATORY: No cough, shortness of breath, wheezing or hemoptysis.  CARDIOVASCULAR: No chest pain, orthopnea, edema.  GASTROINTESTINAL: No nausea, vomiting, diarrhea or abdominal pain.  GENITOURINARY: No dysuria, hematuria.  ENDOCRINE: No polyuria, nocturia,  HEMATOLOGY: No anemia, easy bruising or bleeding SKIN: No rash or lesion. MUSCULOSKELETAL: No joint pain or arthritis.   NEUROLOGIC: No tingling, numbness, weakness.  PSYCHIATRY: No anxiety or depression.   DRUG ALLERGIES:   Allergies  Allergen Reactions  . Aspirin Shortness Of Breath  . Cephalosporins Other (See Comments)    Difficulty walking  . Levaquin [Levofloxacin In D5w] Other (See Comments)    GI Distress, Cramps  . Sulfa Antibiotics Other (See Comments)    unknown    VITALS:  Blood pressure 117/64, pulse 84, temperature 97.9 F (36.6 C), temperature source Oral, resp. rate 21, height 4\' 9"  (1.448 m), weight 78.245 kg (172 lb 8 oz), SpO2 97 %.  PHYSICAL EXAMINATION:  GENERAL:  81 y.o.-year-old patient lying in the bed with no acute distress. Coughing, congested in chest EYES: Pupils equal, round, reactive to light and accommodation. No scleral icterus. Extraocular muscles intact.  HEENT: Head atraumatic, normocephalic. Oropharynx and nasopharynx clear.  NECK:  Supple, no jugular venous distention. No thyroid enlargement, no tenderness.  LUNGS: Moderate breath sounds, positive  crackles. No wheezing present. No use of accessory muscles of respiration, unless coughing.  CARDIOVASCULAR: Irregularly irregular, no rubs, or gallops. Distant.  ABDOMEN: Soft, nontender, nondistended. Bowel sounds present. No organomegaly or mass.  EXTREMITIES: No pedal edema, cyanosis, or clubbing.  NEUROLOGIC: Cranial nerves II through XII are intact. Muscle strength 5/5 in all extremities. Sensation intact. Gait not checked.  PSYCHIATRIC: The patient is alert and oriented x 3.  SKIN: No obvious rash, lesion, or ulcer.    LABORATORY PANEL:   CBC  Recent Labs Lab 07/27/14 0516  WBC 6.6  HGB 11.3*  HCT 34.2*  PLT 151   ------------------------------------------------------------------------------------------------------------------  Chemistries   Recent Labs Lab 07/24/14 0330  07/27/14 0516  NA 139  < > 139  K 3.7  < > 4.2  CL 103  < > 101  CO2 27  < > 31  GLUCOSE 158*  < > 206*  BUN 25*  < > 19  CREATININE 0.96  < > 0.76  CALCIUM 7.9*  < > 8.6*  MG 1.7  --   --   < > = values in this interval not displayed. ------------------------------------------------------------------------------------------------------------------  Cardiac Enzymes  Recent Labs Lab 07/26/14 1316  TROPONINI 0.10*   ------------------------------------------------------------------------------------------------------------------  RADIOLOGY:  Dg Chest 1 View  07/26/2014   CLINICAL DATA:  Shortness of breath  EXAM: CHEST  1 VIEW  COMPARISON:  07/23/2014 and previous  FINDINGS: Cardiac enlargement again noted. Pulmonary fibrosis again noted. Density slightly more pronounced at the lung bases consistent with atelectasis or mild superimposed basilar pneumonia.  IMPRESSION: Cardiomegaly. Background pulmonary fibrosis. Slight worsening of density at  the bases that could represent atelectasis or mild pneumonia.   Electronically Signed   By: Paulina Fusi M.D.   On: 07/26/2014 10:01    EKG:    Orders placed or performed during the hospital encounter of 07/23/14  . ED EKG  . ED EKG  . EKG 12-Lead  . EKG 12-Lead  . EKG 12-Lead  . EKG 12-Lead    ASSESSMENT AND PLAN:   Acute respiratory distress secondary to healthcare associated pneumonia, acute exacerbation of COPD, new onset atrial fibrillation with RVR, resolved now, continue current meds  New-onset atrial fibrillation with RVR, converted to SR now , s/p digoxin with improvement in her heart rate. Patient was hypotensive, dc digoxin , continue Coreg ,  cycled cardiac biomarkers, minimal elevation, no intervention recommended by  Dr. Vennie Homans.  Healthcare associated pneumonia-we will continue IV cefepime and vancomycin is added to the regimen. Get sputum cx  Acute exacerbation of COPD , continue solumedrol, every 6 hours and nebulizer treatments as needed basis.  Septic shock, sepsis and UTI 2/2  Proteus and Klebsiella Clinically better overall, Continue to  hold hypertension medication due to hypotension.Urine cx pending Acute renal failure. Resolved.Holding  lisinopril, off normal saline IV. Elevated troponin. Possibly due to septic shock and acute renal failure, follow-up troponin level is not trending and continue aspirin, Plavix and statin. Chronic diastolic CHF. Stable,  resumed Lasix   Gen weakness- PT eval     All the records are reviewed and case discussed with Care Management/Social Workerr. Management plans discussed with the patient, family and they are in agreement.  CODE STATUS: DNR  TOTAL  time-40 minutes Discussed with family who is present at the bedsite.   Katharina Caper M.D on 07/27/2014 at 3:28 PM  Between 7am to 6pm - Pager - [phone] After 6pm go to [email or web address] - password [email or web address] [facility]  [facility]  Office  [phone]  CC: Primary care physician; Rolm Gala, MD

## 2014-07-27 NOTE — Progress Notes (Signed)
Subjective:  Patient states to feel visibly while today denies any palpitations or tachycardia she states that her breathing is much improved. Denies any chest pain still has some tachycardia using inhalers feels comfortable and ready to move out of the intensive care.  Objective:  Vital Signs in the last 24 hours: Temp:  [97.7 F (36.5 C)-98.6 F (37 C)] 97.9 F (36.6 C) (05/14 1110) Pulse Rate:  [65-134] 84 (05/14 1110) Resp:  [14-23] 21 (05/14 1110) BP: (90-129)/(54-84) 117/64 mmHg (05/14 1110) SpO2:  [93 %-98 %] 97 % (05/14 1110) Weight:  [78.245 kg (172 lb 8 oz)] 78.245 kg (172 lb 8 oz) (05/14 1110)  Intake/Output from previous day: 05/13 0701 - 05/14 0700 In: 450 [IV Piggyback:450] Out: 1225 [Urine:1225] Intake/Output from this shift: Total I/O In: 240 [P.O.:240] Out: -   Physical Exam: HEENT normocephalic/atraumatic Neck exam supple no JVD lungs adenopathy Lungs bilateral rhonchi and occasional wheezing no rales adequate air movement Heart exam tachycardic systolic dyspnea on the left sternal border Abdominal exam is benign Extremities exam mild edema good pulses Neurological exam totally intact   Lab Results:  Recent Labs  07/26/14 0936 07/27/14 0516  WBC 8.3 6.6  HGB 11.3* 11.3*  PLT 175 151    Recent Labs  07/26/14 0936 07/27/14 0516  NA 137 139  K 3.7 4.2  CL 96* 101  CO2 32 31  GLUCOSE 84 206*  BUN 15 19  CREATININE 0.89 0.76    Recent Labs  07/26/14 0936 07/26/14 1316  TROPONINI 0.11* 0.10*   Hepatic Function Panel No results for input(s): PROT, ALBUMIN, AST, ALT, ALKPHOS, BILITOT, BILIDIR, IBILI in the last 72 hours. No results for input(s): CHOL in the last 72 hours. No results for input(s): PROTIME in the last 72 hours.  Imaging: Dg Chest 1 View  07/26/2014   CLINICAL DATA:  Shortness of breath  EXAM: CHEST  1 VIEW  COMPARISON:  07/23/2014 and previous  FINDINGS: Cardiac enlargement again noted. Pulmonary fibrosis again noted.  Density slightly more pronounced at the lung bases consistent with atelectasis or mild superimposed basilar pneumonia.  IMPRESSION: Cardiomegaly. Background pulmonary fibrosis. Slight worsening of density at the bases that could represent atelectasis or mild pneumonia.   Electronically Signed   By: Paulina Fusi M.D.   On: 07/26/2014 10:01    Cardiac Studies:  Assessment/Plan:  Palpitations  Tachycardia Shortness of breath UTI Renal insufficiency GERD Hyperlipidemia Diabetes . PLAN Patient much improved will transfer back to the floor Rhythm appears to be sinus tachycardia evidence of atrial fibrillation DVT prophylaxis Continue inhalers for shortness of breath Continue antibiotics for UTI Agree with Protonix for GERD symptoms Continue diabetes management and control Continue diltiazem for tachycardia Conservative medical therapy from a cardiac standpoint  LOS: 4 days    CALLWOOD,DWAYNE D. 07/27/2014, 2:49 PM

## 2014-07-27 NOTE — Evaluation (Signed)
Physical Therapy Evaluation Patient Details Name: Denise Duke MRN: [id number] DOB: April 11, 1927 Today's Date: 07/27/2014   History of Present Illness  Pt here with hypotension, COPD exacerbation and RVR  Clinical Impression  Pt does well with PT and though her o2 drops to 90% on 3 liters during ambulation she is not overly fatigued and reports to be walking close to her baseline.  She shows good effort and pleasant attitude t/o the session, ultimately does well with PT exam.    Follow Up Recommendations  (return to [facility] with HHPT)    Equipment Recommendations       Recommendations for Other Services       Precautions / Restrictions Precautions Precautions: Fall Restrictions Weight Bearing Restrictions: No      Mobility  Bed Mobility Overal bed mobility: Modified Independent                Transfers Overall transfer level: Modified independent Equipment used: Rolling walker (2 wheeled)                Ambulation/Gait Ambulation/Gait assistance: Min guard Ambulation Distance (Feet): 25 Feet Assistive device: Rolling walker (2 wheeled)       General Gait Details: Pt very slow and cautious with ambulation, but has no losses of balance.  She is used to a more maneuverable rollator and has some issues manipulating the walker.   Stairs            Wheelchair Mobility    Modified Rankin (Stroke Patients Only)       Balance                                             Pertinent Vitals/Pain Pain Assessment: No/denies pain    Home Living Family/patient expects to be discharged to:: Assisted living               Home Equipment: Walker - 4 wheels      Prior Function Level of Independence: Independent with assistive device(s)               Hand Dominance        Extremity/Trunk Assessment   Upper Extremity Assessment: Overall WFL for tasks assessed (age appropriate levels)           Lower Extremity  Assessment:  (grossly 4/5 t/o )         Communication   Communication: HOH  Cognition Arousal/Alertness: Awake/alert Behavior During Therapy: WFL for tasks assessed/performed Overall Cognitive Status: Within Functional Limits for tasks assessed                      General Comments      Exercises        Assessment/Plan    PT Assessment    PT Diagnosis     PT Problem List    PT Treatment Interventions     PT Goals (Current goals can be found in the Care Plan section) Acute Rehab PT Goals Patient Stated Goal: go back home PT Goal Formulation: With patient Time For Goal Achievement: 08/10/14 Potential to Achieve Goals: Good    Frequency     Barriers to discharge        Co-evaluation               End of Session  Time: 1123-1150 PT Time Calculation (min) (ACUTE ONLY): 27 min   Charges:   PT Evaluation $Initial PT Evaluation Tier I: 1 Procedure     PT G Codes:       Loran Senters, PT, DPT [phone]  Malachi Pro 07/27/2014, 1:40 PM

## 2014-07-27 NOTE — Progress Notes (Signed)
PHARMACY - CRITICAL CARE PROGRESS NOTE  Pharmacy Consult for electrolyte management  Indication: critical care patient   Allergies  Allergen Reactions  . Aspirin Shortness Of Breath  . Cephalosporins Other (See Comments)    Difficulty walking  . Levaquin [Levofloxacin In D5w] Other (See Comments)    GI Distress, Cramps  . Sulfa Antibiotics Other (See Comments)    unknown    Patient Measurements: Height: 4\' 9"  (144.8 cm) Weight: 175 lb 14.8 oz (79.8 kg) IBW/kg (Calculated) : 38.6  Vital Signs: Temp: 97.7 F (36.5 C) (05/14 0700) Temp Source: Oral (05/14 0700) BP: 113/62 mmHg (05/14 0730) Pulse Rate: 69 (05/14 0730) Intake/Output from previous day: 05/13 0701 - 05/14 0700 In: 200 [IV Piggyback:200] Out: 975 [Urine:975]  Labs: BMP Latest Ref Rng 07/27/2014 07/26/2014 07/25/2014  Glucose 65 - 99 mg/dL 09/24/2014) 84 92  BUN 6 - 20 mg/dL 19 15 16   Creatinine 0.44 - 1.00 mg/dL 106(Y 6.94  Sodium 135 - 145 mmol/L 139 137 143  Potassium 3.5 - 5.1 mmol/L 4.2 3.7 3.7  Chloride 101 - 111 mmol/L 101 96(L) 108  CO2 22 - 32 mmol/L 31 32 29  Calcium 8.9 - 10.3 mg/dL 8.54) 6.27) 7.7(L)     Recent Labs  07/25/14 0501 07/26/14 0936 07/27/14 0516  WBC 8.3 8.3 6.6  HGB 9.5* 11.3* 11.3*  HCT 29.1* 35.6 34.2*  PLT 151 175 151  CREATININE 0.68 0.89 0.76   Estimated Creatinine Clearance: 44.7 mL/min (by C-G formula based on Cr of 0.76).   Recent Labs  07/26/14 1728 07/26/14 2104 07/27/14 0719  GLUCAP 99 153* 158*   Medications:  Scheduled:  . antiseptic oral rinse  7 mL Mouth Rinse BID  . atorvastatin  80 mg Oral QHS  . ceFEPime (MAXIPIME) IV  2 g Intravenous Q24H  . clopidogrel  75 mg Oral Daily  . digoxin  0.25 mg Oral BID  . diltiazem  30 mg Oral 4 times per day  . enoxaparin (LOVENOX) injection  40 mg Subcutaneous Q24H  . escitalopram  10 mg Oral Daily  . furosemide  20 mg Oral Daily  . gabapentin  600 mg Oral TID  . insulin aspart  0-5 Units Subcutaneous QHS   . insulin aspart  0-9 Units Subcutaneous TID WC  . Ipratropium-Albuterol  1 puff Inhalation QID  . leflunomide  10 mg Oral q morning - 10a  . methylPREDNISolone (SOLU-MEDROL) injection  60 mg Intravenous Q6H  . mometasone-formoterol  2 puff Inhalation BID  . montelukast  10 mg Oral QHS  . pantoprazole  40 mg Oral q morning - 10a  . potassium chloride  10 mEq Oral Daily  . predniSONE  5 mg Oral BID WC  . pregabalin  25 mg Oral TID  . PROBIOTIC FORMULA   Oral q morning - 10a  . vancomycin  1,000 mg Intravenous Q12H   Infusions:   PRN: acetaminophen **OR** acetaminophen, albuterol, clonazePAM, ipratropium-albuterol, ondansetron **OR** ondansetron (ZOFRAN) IV, traMADol  Assessment: Electrolytes WNL  Plan:  Will recheck with AM labs  2105, PharmD  07/27/2014,8:47 AM

## 2014-07-27 NOTE — Progress Notes (Signed)
Report Called to [facility] on 1C, Pt to be transferred via bed with off unit telemetry. Currently on 3L [place].

## 2014-07-27 NOTE — Plan of Care (Addendum)
Problem: Discharge Progression Outcomes Goal: Other Discharge Outcomes/Goals Outcome: Progressing Barriers to progression: none Discharge plan in place: anticipated d/c date unknown at this time Hemodynamically stable: VSS Tolerating diet: fair appetite Activity appropriate: ambulated with physical therapy   Transfer from CCU, tele in place, continues on po medications, receiving IV antibiotics, uses bedpan, BM throughout shift.Heart rate controlled.

## 2014-07-27 NOTE — Progress Notes (Signed)
ANTIBIOTIC CONSULT NOTE - FOLLOW UP  Pharmacy Consult for Cefepime/Vancomycin Dosing Indication: Sepsis/UTI  Allergies  Allergen Reactions  . Aspirin Shortness Of Breath  . Cephalosporins Other (See Comments)    Difficulty walking  . Levaquin [Levofloxacin In D5w] Other (See Comments)    GI Distress, Cramps  . Sulfa Antibiotics Other (See Comments)    unknown    Patient Measurements:   Vital Signs: Temp: 97.7 F (36.5 C) (05/14 0700) Temp Source: Oral (05/14 0700) BP: 113/62 mmHg (05/14 0730) Pulse Rate: 69 (05/14 0730) Intake/Output from previous day: 05/13 0701 - 05/14 0700 In: 200 [IV Piggyback:200] Out: 975 [Urine:975] Intake/Output from this shift:    Labs:  Recent Labs  07/25/14 0501 07/26/14 0936 07/27/14 0516  WBC 8.3 8.3 6.6  HGB 9.5* 11.3* 11.3*  PLT 151 175 151  CREATININE 0.68 0.89 0.76   Estimated Creatinine Clearance: 44.7 mL/min (by C-G formula based on Cr of 0.76).  Microbiology: Recent Results (from the past 720 hour(s))  Culture, blood (single)     Status: None (Preliminary result)   Collection Time: 07/11/14 10:34 AM  Result Value Ref Range Status   Micro Text Report   Preliminary       COMMENT                   NO GROWTH IN 48 HOURS   ANTIBIOTIC                                                      Culture, blood (single)     Status: None (Preliminary result)   Collection Time: 07/11/14 10:48 AM  Result Value Ref Range Status   Micro Text Report   Preliminary       COMMENT                   NO GROWTH IN 48 HOURS   ANTIBIOTIC                                                      Blood Culture (routine x 2)     Status: None (Preliminary result)   Collection Time: 07/23/14 11:25 AM  Result Value Ref Range Status   Specimen Description BLOOD  Final   Special Requests BLOOD  Final   Culture NO GROWTH 3 DAYS  Final   Report Status PENDING  Incomplete  Blood Culture (routine x 2)     Status: None (Preliminary result)   Collection  Time: 07/23/14 11:45 AM  Result Value Ref Range Status   Specimen Description BLOOD  Final   Special Requests BLOOD  Final   Culture NO GROWTH 3 DAYS  Final   Report Status PENDING  Incomplete  Urine culture     Status: None (Preliminary result)   Collection Time: 07/23/14 12:15 PM  Result Value Ref Range Status   Specimen Description URINE, RANDOM  Final   Special Requests URINE, RANDOM  Final   Culture   Final    >=100,000 COLONIES/mL GRAM NEGATIVE RODS 30,000 COLONIES/ml GRAM NEGATIVE RODS IDENTIFICATION AND SUSCEPTIBILITIES TO FOLLOW    Report Status PENDING  Incomplete  Influenza A&B [facility])  Status: None   Collection Time: 07/23/14 12:25 PM  Result Value Ref Range Status   Influenza A [facility]) NOT DETECTED  Final   Influenza B ([facility]) NOT DETECTED  Final    Anti-infectives    Start     Dose/Rate Route Frequency Ordered Stop   07/26/14 1230  vancomycin (VANCOCIN) IVPB 1000 mg/200 mL premix     1,000 mg 200 mL/hr over 60 Minutes Intravenous Every 12 hours 07/26/14 1154     07/26/14 1000  ceFEPIme (MAXIPIME) 2 g in dextrose 5 % 50 mL IVPB     2 g 100 mL/hr over 30 Minutes Intravenous Every 24 hours 07/25/14 2059     07/24/14 1000  ceFEPIme (MAXIPIME) 1 g in dextrose 5 % 50 mL IVPB  Status:  Discontinued     1 g 100 mL/hr over 30 Minutes Intravenous Every 24 hours 07/23/14 1652 07/25/14 2059   07/23/14 2100  vancomycin (VANCOCIN) IVPB 1000 mg/200 mL premix  Status:  Discontinued     1,000 mg 200 mL/hr over 60 Minutes Intravenous Every 24 hours 07/23/14 1652 07/25/14 1552   07/23/14 1115  ceFEPIme (MAXIPIME) 2 g in dextrose 5 % 50 mL IVPB     2 g 100 mL/hr over 30 Minutes Intravenous  Once 07/23/14 1111 07/23/14 1330   07/23/14 1115  vancomycin (VANCOCIN) IVPB 1000 mg/200 mL premix     1,000 mg 200 mL/hr over 60 Minutes Intravenous  Once 07/23/14 1111 07/23/14 1330      Assessment: 81 yo female being treated for Sepsis/UTI. Urine Culture growing Proteus and  Enterococcus. Vancomycin d/c'd 5/12, resumed 5/13 due to fever spike   Plan:  Continue cefepime 2g IV q24hr and vancomycin 1gm IV Q12H. Will check through 5/15 prior to 12:30 dose. This will be at steady state.  Pharmacy will continue to monitor and adjust per consult.    Garlon Hatchet, PharmD 07/27/2014,8:27 AM

## 2014-07-28 DIAGNOSIS — J441 Chronic obstructive pulmonary disease with (acute) exacerbation: Secondary | ICD-10-CM

## 2014-07-28 DIAGNOSIS — I4891 Unspecified atrial fibrillation: Secondary | ICD-10-CM

## 2014-07-28 DIAGNOSIS — J189 Pneumonia, unspecified organism: Secondary | ICD-10-CM

## 2014-07-28 DIAGNOSIS — R0603 Acute respiratory distress: Secondary | ICD-10-CM

## 2014-07-28 LAB — BASIC METABOLIC PANEL
ANION GAP: 7 (ref 5–15)
BUN: 28 mg/dL — ABNORMAL HIGH (ref 6–20)
CO2: 32 mmol/L (ref 22–32)
Calcium: 8 mg/dL — ABNORMAL LOW (ref 8.9–10.3)
Chloride: 100 mmol/L — ABNORMAL LOW (ref 101–111)
Creatinine, Ser: 0.85 mg/dL (ref 0.44–1.00)
GFR calc Af Amer: >60 mL/min (ref >60)
Glucose, Bld: 193 mg/dL — ABNORMAL HIGH (ref 65–99)
POTASSIUM: 4.4 mmol/L (ref 3.5–5.1)
Sodium: 139 mmol/L (ref 135–145)

## 2014-07-28 LAB — GLUCOSE, CAPILLARY
GLUCOSE-CAPILLARY: 162 mg/dL — AB (ref 65–99)
GLUCOSE-CAPILLARY: 212 mg/dL — AB (ref 65–99)
Glucose-Capillary: 395 mg/dL — ABNORMAL HIGH (ref 65–99)
Glucose-Capillary: 171 mg/dL — ABNORMAL HIGH (ref 65–99)

## 2014-07-28 LAB — CULTURE, BLOOD (ROUTINE X 2)
Culture: NO GROWTH
Culture: NO GROWTH

## 2014-07-28 LAB — MAGNESIUM
MAGNESIUM: 2 mg/dL (ref 1.7–2.4)
Magnesium: 2 mg/dL (ref 1.7–2.4)

## 2014-07-28 LAB — VANCOMYCIN, TROUGH: Vancomycin Tr: 27 ug/mL (ref 10.0–20.0)

## 2014-07-28 LAB — POTASSIUM: Potassium: 4.3 mmol/L (ref 3.5–5.1)

## 2014-07-28 LAB — PHOSPHORUS: Phosphorus: 3.7 mg/dL (ref 2.5–4.6)

## 2014-07-28 MED ORDER — TRAMADOL HCL 50 MG PO TABS: 50.0000 mg | ORAL_TABLET | Freq: Two times a day (BID) | ORAL | Status: DC | PRN

## 2014-07-28 MED ORDER — CLONAZEPAM 0.5 MG PO TABS: 0.5000 mg | ORAL_TABLET | Freq: Three times a day (TID) | ORAL | Status: DC | PRN

## 2014-07-28 MED ORDER — CARVEDILOL 6.25 MG PO TABS: 6.2500 mg | ORAL_TABLET | Freq: Two times a day (BID) | ORAL | Status: DC

## 2014-07-28 MED ORDER — VANCOMYCIN HCL IN DEXTROSE 1-5 GM/200ML-% IV SOLN
1000.0000 mg | INTRAVENOUS | Status: DC
  Administered 2014-07-28 – 2014-07-30 (×3): 1000 mg via INTRAVENOUS
  Filled 2014-07-28 (×3): qty 200

## 2014-07-28 MED ORDER — CEPHALEXIN 500 MG PO CAPS
500.0000 mg | ORAL_CAPSULE | Freq: Three times a day (TID) | ORAL | Status: DC
Start: 2014-07-28 — End: 2014-08-07

## 2014-07-28 MED ORDER — METHYLPREDNISOLONE 4 MG PO TBPK: ORAL_TABLET | ORAL | Status: DC

## 2014-07-28 NOTE — Progress Notes (Signed)
[facility] [place] at [facility]   PATIENT NAME: Denise Duke    MR#:  [id number]  DATE OF BIRTH:  [birth year]  SUBJECTIVE:  CHIEF COMPLAINT:  Pt is afebrile today again, HR is well controled, now, in 80-90. Patient denies any chest pain during my examination, admits of some cough, white sputum  REVIEW OF SYSTEMS:  CONSTITUTIONAL: No fever, fatigue or weakness.  EYES: No blurred or double vision.  EARS, NOSE, AND THROAT: No tinnitus or ear pain.  RESPIRATORY: No cough, shortness of breath, wheezing or hemoptysis.  CARDIOVASCULAR: No chest pain, orthopnea, edema.  GASTROINTESTINAL: No nausea, vomiting, diarrhea or abdominal pain.  GENITOURINARY: No dysuria, hematuria.  ENDOCRINE: No polyuria, nocturia,  HEMATOLOGY: No anemia, easy bruising or bleeding SKIN: No rash or lesion. MUSCULOSKELETAL: No joint pain or arthritis.   NEUROLOGIC: No tingling, numbness, weakness.  PSYCHIATRY: No anxiety or depression.   DRUG ALLERGIES:   Allergies  Allergen Reactions  . Aspirin Shortness Of Breath  . Cephalosporins Other (See Comments)    Difficulty walking  . Levaquin [Levofloxacin In D5w] Other (See Comments)    GI Distress, Cramps  . Sulfa Antibiotics Other (See Comments)    unknown    VITALS:  Blood pressure 126/54, pulse 74, temperature 97.7 F (36.5 C), temperature source Oral, resp. rate 18, height 4\' 9"  (1.448 m), weight 78.245 kg (172 lb 8 oz), SpO2 98 %.  PHYSICAL EXAMINATION:  GENERAL:  81 y.o.-year-old patient lying in the bed with no acute distress. No cough, less congested in chest.  EYES: Pupils equal, round, reactive to light and accommodation. No scleral icterus. Extraocular muscles intact.  HEENT: Head atraumatic, normocephalic. Oropharynx and nasopharynx clear.  NECK:  Supple, no jugular venous distention. No thyroid enlargement, no tenderness.  LUNGS: Good breath sounds, positive crackles on the right posteriorly. No wheezing present. No  use of accessory muscles of respiration, unless coughing.  CARDIOVASCULAR: regular, no rubs, or gallops. Distant.  ABDOMEN: Soft, nontender, nondistended. Bowel sounds present. No organomegaly or mass.  EXTREMITIES: No pedal edema, cyanosis, or clubbing.  NEUROLOGIC: Cranial nerves II through XII are intact. Muscle strength 5/5 in all extremities. Sensation intact. Gait not checked.  PSYCHIATRIC: The patient is alert and oriented x 3.  SKIN: No obvious rash, lesion, or ulcer.    LABORATORY PANEL:   CBC  Recent Labs Lab 07/27/14 0516  WBC 6.6  HGB 11.3*  HCT 34.2*  PLT 151   ------------------------------------------------------------------------------------------------------------------  Chemistries   Recent Labs Lab 07/28/14 0625  NA 139  K 4.4  CL 100*  CO2 32  GLUCOSE 193*  BUN 28*  CREATININE 0.85  CALCIUM 8.0*  MG 2.0   ------------------------------------------------------------------------------------------------------------------  Cardiac Enzymes  Recent Labs Lab 07/26/14 1316  TROPONINI 0.10*   Microbiology report by telephone conversation urine cultures are growing Proteus as well as Klebsiella. Information  received per tubing system just now, it appears that patient's Proteus mirabilis grows more than 100,000 colony forming units,  sensitive to ampicillin, ceftazidime, cefazolin, as well as ceftriaxone,  intermediately sensitive to ciprofloxacin,  sensitive to gentamicin as well as imipenem,  trimethoprim sulfamethoxazole as well as cefoxitin. Klebsiella growth is  30,000 colony-forming units, resistant to ampicillin, sensitive to ceftazidime, cefazolin, ceftriaxone, ciprofloxacin, gentamicin, imipenem, trimethoprim sulfamethoxazole.  ------------------------------------------------------------------------------------------------------------------  RADIOLOGY:  No results found.  EKG:   Orders placed or performed during the hospital encounter of  07/23/14  . ED EKG  . ED EKG  . EKG 12-Lead  . EKG  12-Lead  . EKG 12-Lead  . EKG 12-Lead    ASSESSMENT AND PLAN:   Acute respiratory distress secondary to healthcare associated pneumonia, acute exacerbation of COPD, new onset atrial fibrillation with RVR, resolved now, continue current meds, ambulate, watch for symptoms, dc home with HH if PT recommends it.   New-onset atrial fibrillation with RVR, converted to SR now , s/p digoxin with improvement in her heart rate. Patient was hypotensive, now resolved, off digoxin , continue Coreg ,  cycled cardiac biomarkers, minimal elevation, no intervention recommended by  Dr. Juliann Pares.  Healthcare associated pneumonia-we will continue IV cefepime and vancomycin is added to the regimen. sputum cx not available  Acute exacerbation of COPD , continue solumedrol, every 6 hours and nebulizer treatments as needed basis.  Septic shock, sepsis and UTI 2/2  Proteus and Klebsiella Clinically better overall, Continue to  hold hypertension medication due to hypotension. Start septra DS, watching for symptoms Acute renal failure. Resolved. Resume lisinopril, off normal saline IV. Elevated troponin. Possibly due to septic shock and acute renal failure, follow-up troponin level is not trending and continue aspirin, Plavix and statin. Chronic diastolic CHF. Stable,  resumed Lasix   Gen weakness- PT eval, poss discharge home with home health, following PT recommendations.      All the records are reviewed and case discussed with Care Management/Social Workerr. Management plans discussed with the patient, family and they are in agreement.  CODE STATUS: DNR  TOTAL  time-40 minutes    Yarithza Mink M.D on 07/28/2014 at 11:23 AM  Between 7am to 6pm - Pager - [phone] After 6pm go to [email or web address] - password [email or web address]  [facility]  Office  [phone]  CC: Primary care physician; Rolm Gala, MD

## 2014-07-28 NOTE — Discharge Summary (Addendum)
[facility] [place] at [facility]   PATIENT NAME: Denise Duke    MR#:  [id number]  DATE OF BIRTH:  1928/01/14  DATE OF ADMISSION:  07/23/2014 ADMITTING PHYSICIAN: Shaune Pollack, MD  DATE OF DISCHARGE: 5.17.16  PRIMARY CARE PHYSICIAN: Rolm Gala, MD     ADMISSION DIAGNOSIS:  Acute UTI [N39.0] Sepsis, due to unspecified organism [A41.9]  DISCHARGE DIAGNOSIS:  Principal Problem:   UTI (lower urinary tract infection) Active Problems:   Sepsis   ARF (acute renal failure)   Septic shock   Acute respiratory distress   Pneumonia   Atrial fibrillation   COPD exacerbation   SECONDARY DIAGNOSIS:   Past Medical History  Diagnosis Date  . CHF (congestive heart failure)   . TIA (transient ischemic attack)   . Anxiety   . COPD (chronic obstructive pulmonary disease)   . Hypertension     .pro HOSPITAL COURSE:   Denise Duke is a 81 year old female with known history of congestive heart failure as well as COPD, hypertension and history of TIA who was sent from assisted living facility with weakness, mild shortness of breath as well as dehydration. Please refer to Dr. Nicky Pugh admission note, done on 07/23/2014. On arrival to the hospital patient was noted to be tachycardic as well as hypotensive. She was noted to have renal insufficiency with creatinine  level of 1.27. Chest x-ray done on the day of admission showed pulmonary fibrosis, but no definite CHF or pneumonia. Her urine analysis was markedly abnormal with 2+ blood, 2+ leukocytes from 0-5 red blood cells as well as 2 numerous to count white blood cells. Blood cultures taken on 07/23/2014 showed no growth. Urine cultures revealed more than 100,000 colony-forming units Proteus and more than 50,000 colony-forming units  of Klebsiella. She was initiated on broad-spectrum antibiotic therapy. However, remained febrile and deteriorated. She went into atrial fibrillation, RVR and was hypotensive with A. fib,  RVR. Patient had to be sent to intensive care unit for close observation. Slowly, however, her condition improved and she felt much more comfortable. She also returned back into normal sinus rhythm. He had no significantly worse lung symptoms, but her repeat the chest x-ray showed slight worsening of density at the bases, which could represent atelectasis or mild pneumonia. Chest x-ray was performed on 07/26/2014. No sputum cultures were obtained unfortunately. Of note, influenza testing A and B was negative.  Discussion by the problem  Acute respiratory distress secondary to suspected pneumonia, acute exacerbation of COPD, new onset atrial fibrillation with RVR, resolved now, continue current meds, ambulate, watch for symptoms, dc home with HH today  New-onset atrial fibrillation with RVR, converted to SR now , s/p digoxin with improvement in her heart rate. Patient was hypotensive, now resolved, off digoxin , advance Coreg , cycled cardiac biomarkers, minimal elevation, no intervention recommended by Dr. Juliann Pares.  Healthcare associated pneumonia- dc  IV cefepime and vancomycin, unlikely pneumonia, as minimally symptomatic. sputum cx not available, continue Keflex for 10 days total. Follow sputum cultures if available. Follow-up with primary care physician and possibly pulmonologist if condition worsens. Just antibiotics according to culture results  Acute exacerbation of COPD , discontinue solumedrol, start Medrol tapering dose , continue nebulizer treatments as needed basis. Prescribe nebulizing machine.   Septic shock, sepsis and UTI 2/2 Proteus and Klebsiella Clinically better overall, Continue to hold hypertension medication due to hypotension. Start Keflex per urine culture sensitivity results, watching for symptoms.  Acute renal failure. Resolved. Resume lisinopril, off  IV. Follow kidney function closely as outpatient Elevated troponin. Possibly due to septic shock and acute renal failure,  follow-up troponin level is not trending and continue aspirin, Plavix and statin. Chronic diastolic CHF. Stable, resumed Lasix   Gen weakness- PT eval done, discharge home with home health, following PT recommendations  Developed Vtach , junctional tachycardia and not discharged 5.15.16, discussed with cardiology, [facility], who recommended to resume Coreg, at that point digoxin and Cardizem were discontinued and coreg was resumed, dose was advanced , patient continued to have intermittent episodes of nonsustained Vtach , asymptomatic, but no other interventions or therapies were recommended by cardiology, but follow up with him in the office in the next week. Patient felt satisfactory today, May 17th, ready to go home with [facility]  DISCHARGE CONDITIONS:   Fair  CONSULTS OBTAINED:  Treatment Team:  Alwyn Pea, MD  DRUG ALLERGIES:   Allergies  Allergen Reactions  . Aspirin Shortness Of Breath  . Cephalosporins Other (See Comments)    Difficulty walking  . Levaquin [Levofloxacin In D5w] Other (See Comments)    GI Distress, Cramps  . Sulfa Antibiotics Other (See Comments)    unknown    DISCHARGE MEDICATIONS:   Current Discharge Medication List    START taking these medications   Details  cephALEXin (KEFLEX) 500 MG capsule Take 1 capsule (500 mg total) by mouth 3 (three) times daily. Qty: 30 capsule, Refills: 0    methylPREDNISolone (MEDROL DOSEPAK) 4 MG TBPK tablet follow package directions Qty: 21 tablet, Refills: 0      CONTINUE these medications which have CHANGED   Details  carvedilol (COREG) 6.25 MG tablet Take 1 tablet (6.25 mg total) by mouth 2 (two) times daily with a meal. Qty: 60 tablet, Refills: 1    clonazePAM (KLONOPIN) 0.5 MG tablet Take 1 tablet (0.5 mg total) by mouth 3 (three) times daily as needed (nervousness). Qty: 30 tablet, Refills: 0    !! traMADol (ULTRAM) 50 MG tablet Take 1 tablet (50 mg total) by mouth 2 (two) times daily as needed for  moderate pain (pain). Qty: 30 tablet, Refills: 0     !! - Potential duplicate medications found. Please discuss with provider.    CONTINUE these medications which have NOT CHANGED   Details  acetaminophen (TYLENOL) 500 MG tablet Take 500 mg by mouth 2 (two) times daily as needed (pain).    albuterol (PROVENTIL) (2.5 MG/3ML) 0.083% nebulizer solution Take 3 mLs by nebulization every 6 (six) hours as needed for wheezing.    alendronate (FOSAMAX) 70 MG tablet Take 70 mg by mouth once a week. Take with a full glass of water on an empty stomach.    atorvastatin (LIPITOR) 80 MG tablet Take 80 mg by mouth at bedtime.    Calcium Carbonate-Vitamin D 600-400 MG-UNIT per tablet Take 1 tablet by mouth 2 (two) times daily.    capsaicin (ZOSTRIX) 0.025 % cream Apply topically 2 (two) times daily. Apply topically to affected area 2 times a day, as needed for pain    celecoxib (CELEBREX) 200 MG capsule Take 200 mg by mouth daily as needed (as needed for pain).    clopidogrel (PLAVIX) 75 MG tablet Take 75 mg by mouth daily.    CRANBERRY EXTRACT PO Take 425 mg by mouth daily. Cranberry tablet    escitalopram (LEXAPRO) 10 MG tablet Take 10 mg by mouth daily.    Fluticasone-Salmeterol (ADVAIR) 500-50 MCG/DOSE AEPB Inhale 1 puff into the lungs 2 (two) times  daily.    furosemide (LASIX) 20 MG tablet Take 20 mg by mouth daily.    gabapentin (NEURONTIN) 600 MG tablet Take 600 mg by mouth 3 (three) times daily.    Ipratropium-Albuterol (COMBIVENT) 20-100 MCG/ACT AERS respimat Inhale 1 puff into the lungs 4 (four) times daily.    isosorbide mononitrate (IMDUR) 30 MG 24 hr tablet Take 30 mg by mouth daily.    leflunomide (ARAVA) 20 MG tablet Take 10 mg by mouth every morning. Take half tab of 20mg     lisinopril (PRINIVIL,ZESTRIL) 2.5 MG tablet Take 1.25 mg by mouth daily. Take half tab of 2.5mg     loperamide (IMODIUM) 2 MG capsule Take by mouth 4 (four) times daily as needed for diarrhea or loose  stools.    montelukast (SINGULAIR) 10 MG tablet Take 10 mg by mouth at bedtime.    pantoprazole (PROTONIX) 40 MG tablet Take 40 mg by mouth every morning.    potassium chloride (K-DUR) 10 MEQ tablet Take 10 mEq by mouth daily.    predniSONE (DELTASONE) 5 MG tablet Take 5 mg by mouth 2 (two) times daily with a meal.    pregabalin (LYRICA) 25 MG capsule Take 25 mg by mouth 3 (three) times daily.    Probiotic Product (PROBIOTIC FORMULA PO) Take by mouth every morning. Probiotic Formula-oral cap    !! traMADol (ULTRAM) 50 MG tablet Take 50 mg by mouth 2 (two) times daily as needed (pain).    albuterol-ipratropium (COMBIVENT) 18-103 MCG/ACT inhaler Inhale into the lungs every 4 (four) hours.    calcium carbonate (OS-CAL) 600 MG TABS tablet Take 600 mg by mouth 2 (two) times daily with a meal.     !! - Potential duplicate medications found. Please discuss with provider.       DISCHARGE INSTRUCTIONS:    Follow-up with primary care physician as well as cardiologist in the next 1 week after discharge. Follow kidney function test as well as heart rate and rhythm and make decisions about initiation  of anticoagulation therapy if needed, continue aspirin and Plavix for now as well as advanced Coreg doses  If you experience worsening of your admission symptoms, develop shortness of breath, life threatening emergency, suicidal or homicidal thoughts you must seek medical attention immediately by calling 911 or calling your MD immediately  if symptoms less severe.  You Must read complete instructions/literature along with all the possible adverse reactions/side effects for all the Medicines you take and that have been prescribed to you. Take any new Medicines after you have completely understood and accept all the possible adverse reactions/side effects.   Please note  You were cared for by a hospitalist during your hospital stay. If you have any questions about your discharge medications or the  care you received while you were in the hospital after you are discharged, you can call the unit and asked to speak with the hospitalist on call if the hospitalist that took care of you is not available. Once you are discharged, your primary care physician will handle any further medical issues. Please note that NO REFILLS for any discharge medications will be authorized once you are discharged, as it is imperative that you return to your primary care physician (or establish a relationship with a primary care physician if you do not have one) for your aftercare needs so that they can reassess your need for medications and monitor your lab values.    Today   CHIEF COMPLAINT:   Chief Complaint  Patient  presents with  . Shortness of Breath    HISTORY OF PRESENT ILLNESS:  Denise Duke  is a 81 y.o. female with a known history of   CHF (congestive heart failure)   . TIA (transient ischemic attack)   . Anxiety   . COPD (chronic obstructive pulmonary disease)   . Hypertension        was sent from assisted living facility with weakness, mild shortness of breath as well as dehydration. Please refer to Dr. Nicky Pugh admission note, done on 07/23/2014. On arrival to the hospital patient was noted to be tachycardic as well as hypotensive. She was noted to have renal insufficiency with creatinine  level of 1.27. Chest x-ray done on the day of admission showed pulmonary fibrosis, but no definite CHF or pneumonia. Her urine analysis was markedly abnormal with 2+ blood, 2+ leukocytes from 0-5 red blood cells as well as 2 numerous to count white blood cells. Blood cultures taken on 07/23/2014 showed no growth. Urine cultures revealed more than 100,000 colony-forming units Proteus and more than 50,000 colony-forming units  of Klebsiella. She was initiated on broad-spectrum antibiotic therapy. However, remained febrile and deteriorated. She went into atrial fibrillation, RVR and was hypotensive with  A. fib, RVR. Patient had to be sent to intensive care unit for close observation. Slowly, however, her condition improved and she felt much more comfortable. She also returned back into normal sinus rhythm. He had no significantly worse lung symptoms, but her repeat the chest x-ray showed slight worsening of density at the bases, which could represent atelectasis or mild pneumonia. Chest x-ray was performed on 07/26/2014. No sputum cultures were obtained unfortunately. Of note, influenza testing A and B was negative.  Discussion by the problem  Acute respiratory distress secondary to suspected pneumonia, acute exacerbation of COPD, new onset atrial fibrillation with RVR, resolved now, continue current meds, ambulate, watch for symptoms, dc home with HH today  New-onset atrial fibrillation with RVR, converted to SR now , s/p digoxin with improvement in her heart rate. Patient was hypotensive, now resolved, off digoxin , advance Coreg , cycled cardiac biomarkers, minimal elevation, no intervention recommended by Dr. Juliann Pares.  Healthcare associated pneumonia- dc  IV cefepime and vancomycin, unlikely pneumonia, as minimally symptomatic. sputum cx not available, continue Keflex for 10 days total. Follow sputum cultures if available. Follow-up with primary care physician and possibly pulmonologist if condition worsens. Just antibiotics according to culture results  Acute exacerbation of COPD , discontinue solumedrol, start Medrol tapering dose , continue nebulizer treatments as needed basis. Prescribe nebulizing machine.   Septic shock, sepsis and UTI 2/2 Proteus and Klebsiella Clinically better overall, Continue to hold hypertension medication due to hypotension. Start Keflex per urine culture sensitivity results, watching for symptoms.  Acute renal failure. Resolved. Resume lisinopril, off IV. Follow kidney function closely as outpatient Elevated troponin. Possibly due to septic shock and acute renal  failure, follow-up troponin level is not trending and continue aspirin, Plavix and statin. Chronic diastolic CHF. Stable, resumed Lasix  Episode of unsustained V. Tach, continue advanced doses of beta blockers, aspirin as well as Plavix. Check potassium and magnesium levels. Plan discharge as able..  Gen weakness- PT eval done, discharge home with home health, following PT recommendations    VITAL SIGNS:  Blood pressure 150/55, pulse 85, temperature 97.7 F (36.5 C), temperature source Oral, resp. rate 18, height  (1.448 m), weight 78.245 kg (172 lb 8 oz), SpO2 98 %.  I/O:    Intake/Output  Summary (Last 24 hours) at 07/28/14 1520 Last data filed at 07/28/14 1200  Gross per 24 hour  Intake    730 ml  Output      0 ml  Net    730 ml    PHYSICAL EXAMINATION:  GENERAL:  81 y.o.-year-old patient lying in the bed with no acute distress.  EYES: Pupils equal, round, reactive to light and accommodation. No scleral icterus. Extraocular muscles intact.  HEENT: Head atraumatic, normocephalic. Oropharynx and nasopharynx clear.  NECK:  Supple, no jugular venous distention. No thyroid enlargement, no tenderness.  LUNGS: Normal breath sounds bilaterally, no wheezing, rales,rhonchi or crepitation. No use of accessory muscles of respiration.  CARDIOVASCULAR: S1, S2 normal. No murmurs, rubs, or gallops.  ABDOMEN: Soft, non-tender, non-distended. Bowel sounds present. No organomegaly or mass.  EXTREMITIES: No pedal edema, cyanosis, or clubbing.  NEUROLOGIC: Cranial nerves II through XII are intact. Muscle strength 5/5 in all extremities. Sensation intact. Gait not checked.  PSYCHIATRIC: The patient is alert and oriented x 3.  SKIN: No obvious rash, lesion, or ulcer.   DATA REVIEW:   CBC  Recent Labs Lab 07/27/14 0516  WBC 6.6  HGB 11.3*  HCT 34.2*  PLT 151    Chemistries   Recent Labs Lab 07/28/14 0625  NA 139  K 4.4  CL 100*  CO2 32  GLUCOSE 193*  BUN 28*  CREATININE  0.85  CALCIUM 8.0*  MG 2.0    Cardiac Enzymes  Recent Labs Lab 07/26/14 1316  TROPONINI 0.10*    Microbiology Results  Results for orders placed or performed during the hospital encounter of 07/23/14  Blood Culture (routine x 2)     Status: None   Collection Time: 07/23/14 11:25 AM  Result Value Ref Range Status   Specimen Description BLOOD  Final   Special Requests BLOOD  Final   Culture NO GROWTH 5 DAYS  Final   Report Status 07/28/2014 FINAL  Final  Blood Culture (routine x 2)     Status: None   Collection Time: 07/23/14 11:45 AM  Result Value Ref Range Status   Specimen Description BLOOD  Final   Special Requests BLOOD  Final   Culture NO GROWTH 5 DAYS  Final   Report Status 07/28/2014 FINAL  Final  Urine culture     Status: None (Preliminary result)   Collection Time: 07/23/14 12:15 PM  Result Value Ref Range Status   Specimen Description URINE, RANDOM  Final   Special Requests URINE, RANDOM  Final   Culture   Final    >=100,000 COLONIES/mL GRAM NEGATIVE RODS 30,000 COLONIES/ml GRAM NEGATIVE RODS IDENTIFICATION AND SUSCEPTIBILITIES TO FOLLOW    Report Status PENDING  Incomplete  Influenza A&B Antigens [facility])     Status: None   Collection Time: 07/23/14 12:25 PM  Result Value Ref Range Status   Influenza A ([facility]) NOT DETECTED  Final   Influenza B ([facility]) NOT DETECTED  Final    RADIOLOGY:  No results found.  EKG:   Orders placed or performed during the hospital encounter of 07/23/14  . ED EKG  . ED EKG  . EKG 12-Lead  . EKG 12-Lead  . EKG 12-Lead  . EKG 12-Lead      Management plans discussed with the patient, family and they are in agreement.  CODE STATUS:     Code Status Orders        Start     Ordered   07/23/14 1706  Do not attempt  resuscitation (DNR)   Continuous    Question Answer Comment  In the event of cardiac or respiratory ARREST Do not call a "code blue"   In the event of cardiac or respiratory ARREST Do not perform Intubation,  CPR, defibrillation or ACLS   In the event of cardiac or respiratory ARREST Use medication by any route, position, wound care, and other measures to relive pain and suffering. May use oxygen, suction and manual treatment of airway obstruction as needed for comfort.      07/23/14 1705    Advance Directive Documentation        Most Recent Value   Type of Advance Directive  Out of facility DNR (pink MOST or yellow form)   Pre-existing out of facility DNR order (yellow form or pink MOST form)  Yellow form placed in chart (order not valid for inpatient use)   "MOST" Form in Place?        TOTAL TIME TAKING CARE OF THIS PATIENT: 40 minutes.    Katharina Caper M.D on 07/28/2014 at 3:20 PM  Between 7am to 6pm - Pager - [phone]  After 6pm go to [email or web address] - password [email or web address] [facility]  [place] [facility]  Office  [phone]  CC: Primary care physician; Rolm Gala, MD

## 2014-07-28 NOTE — Progress Notes (Signed)
Off Unit Telemetry monitor tech, Barth Kirks called at 1438 to advise that the pt had a 5 beat run of VTach nonsustained at 1436; upon entering the pt's room pt getting off of the [facility] and back to bed with the NT's assistance; pt denies any complaints at this time; VS obtained by NT; Dr Winona Legato notified via telephone of said documentation;

## 2014-07-28 NOTE — Progress Notes (Signed)
ANTIBIOTIC CONSULT NOTE - FOLLOW UP  Pharmacy Consult for Cefepime/Vancomycin Dosing Indication: HCAP  Allergies  Allergen Reactions  . Aspirin Shortness Of Breath  . Cephalosporins Other (See Comments)    Difficulty walking  . Levaquin [Levofloxacin In D5w] Other (See Comments)    GI Distress, Cramps  . Sulfa Antibiotics Other (See Comments)    unknown    Patient Measurements:   Vital Signs: Temp: 97.7 F (36.5 C) (05/15 0514) Temp Source: Oral (05/15 0514) BP: 150/55 mmHg (05/15 1447) Pulse Rate: 85 (05/15 1447) Intake/Output from previous day: 05/14 0701 - 05/15 0700 In: 730 [P.O.:480; IV Piggyback:250] Out: -  Intake/Output from this shift: Total I/O In: 240 [P.O.:240] Out: -   Labs:  Recent Labs  07/26/14 0936 07/27/14 0516 07/28/14 0625  WBC 8.3 6.6  --   HGB 11.3* 11.3*  --   PLT 175 151  --   CREATININE 0.89 0.76 0.85   Estimated Creatinine Clearance: 41.6 mL/min (by C-G formula based on Cr of 0.85).  Microbiology: Recent Results (from the past 720 hour(s))  Culture, blood (single)     Status: None (Preliminary result)   Collection Time: 07/11/14 10:34 AM  Result Value Ref Range Status   Micro Text Report   Preliminary       COMMENT                   NO GROWTH IN 48 HOURS   ANTIBIOTIC                                                      Culture, blood (single)     Status: None (Preliminary result)   Collection Time: 07/11/14 10:48 AM  Result Value Ref Range Status   Micro Text Report   Preliminary       COMMENT                   NO GROWTH IN 48 HOURS   ANTIBIOTIC                                                      Blood Culture (routine x 2)     Status: None   Collection Time: 07/23/14 11:25 AM  Result Value Ref Range Status   Specimen Description BLOOD  Final   Special Requests BLOOD  Final   Culture NO GROWTH 5 DAYS  Final   Report Status 07/28/2014 FINAL  Final  Blood Culture (routine x 2)     Status: None   Collection Time:  07/23/14 11:45 AM  Result Value Ref Range Status   Specimen Description BLOOD  Final   Special Requests BLOOD  Final   Culture NO GROWTH 5 DAYS  Final   Report Status 07/28/2014 FINAL  Final  Urine culture     Status: None (Preliminary result)   Collection Time: 07/23/14 12:15 PM  Result Value Ref Range Status   Specimen Description URINE, RANDOM  Final   Special Requests URINE, RANDOM  Final   Culture   Final    >=100,000 COLONIES/mL GRAM NEGATIVE RODS 30,000 COLONIES/ml GRAM NEGATIVE RODS IDENTIFICATION AND SUSCEPTIBILITIES TO FOLLOW  Report Status PENDING  Incomplete  Influenza A&B Antigens [facility])     Status: None   Collection Time: 07/23/14 12:25 PM  Result Value Ref Range Status   Influenza A [facility]) NOT DETECTED  Final   Influenza B ([facility]) NOT DETECTED  Final    Anti-infectives    Start     Dose/Rate Route Frequency Ordered Stop   07/28/14 0000  cephALEXin (KEFLEX) 500 MG capsule     500 mg Oral 3 times daily 07/28/14 1451     07/26/14 1230  vancomycin (VANCOCIN) IVPB 1000 mg/200 mL premix  Status:  Discontinued     1,000 mg 200 mL/hr over 60 Minutes Intravenous Every 12 hours 07/26/14 1154 07/28/14 1456   07/26/14 1000  ceFEPIme (MAXIPIME) 2 g in dextrose 5 % 50 mL IVPB     2 g 100 mL/hr over 30 Minutes Intravenous Every 24 hours 07/25/14 2059     07/24/14 1000  ceFEPIme (MAXIPIME) 1 g in dextrose 5 % 50 mL IVPB  Status:  Discontinued     1 g 100 mL/hr over 30 Minutes Intravenous Every 24 hours 07/23/14 1652 07/25/14 2059   07/23/14 2100  vancomycin (VANCOCIN) IVPB 1000 mg/200 mL premix  Status:  Discontinued     1,000 mg 200 mL/hr over 60 Minutes Intravenous Every 24 hours 07/23/14 1652 07/25/14 1552   07/23/14 1115  ceFEPIme (MAXIPIME) 2 g in dextrose 5 % 50 mL IVPB     2 g 100 mL/hr over 30 Minutes Intravenous  Once 07/23/14 1111 07/23/14 1330   07/23/14 1115  vancomycin (VANCOCIN) IVPB 1000 mg/200 mL premix     1,000 mg 200 mL/hr over 60 Minutes Intravenous   Once 07/23/14 1111 07/23/14 1330     Vancomycin trough 5/15 @ 11:49 = 37mcg/ml  Assessment: 81 yo female being treated for Sepsis/UTI as well as HCAP  Vancomycin supratherapeutic   Plan:   Current orders for vancomycin 1gm IV Q12H, will change to vancomycin 1gm IV Q18H for target trough of 15-61mcg/ml. Will re-check trough 5/17 at 06:30  Continue cefepime 2g IV q24hr  Pharmacy will continue to monitor and adjust per consult.    Garlon Hatchet, PharmD 07/28/2014,3:01 PM

## 2014-07-28 NOTE — Progress Notes (Signed)
CSW spoke with RN who reports pt no longer medically stable for return to [facility]) today. CSW confirmed with Crystal [phone] at [facility] who reports pt is able to return at d/c with HHPT. Crystal reports [facility] as [facility] agency preference. CSW will follow. Dellie Burns, MSW, LCSW [phone] (weekend coverage)

## 2014-07-28 NOTE — Progress Notes (Signed)
Ms Denise Duke was to be discharged today to [facility] with home health PT provided by her choice of [facility] as her PT provider. However, discharge today is currently on hold per cardiac issues. No Discharge Orders written.

## 2014-07-28 NOTE — Progress Notes (Signed)
Subjective:  She feels of with reduded sob , improved wheezing. No cp no angina.  Objective:  Vital Signs in the last 24 hours: Temp:  [97.6 F (36.4 C)-97.7 F (36.5 C)] 97.6 F (36.4 C) (05/15 1640) Pulse Rate:  [74-89] 85 (05/15 1447) Resp:  [18-20] 18 (05/15 1430) BP: (125-150)/(54-66) 150/55 mmHg (05/15 1447) SpO2:  [96 %-99 %] 98 % (05/15 1447)  Intake/Output from previous day: 05/14 0701 - 05/15 0700 In: 730 [P.O.:480; IV Piggyback:250] Out: -  Intake/Output from this shift: Total I/O In: 240 [P.O.:240] Out: -   Physical Exam: General appearance: alert, cooperative and appears stated age Neck: no adenopathy, no carotid bruit, no JVD, supple, symmetrical, trachea midline and thyroid not enlarged, symmetric, no tenderness/mass/nodules Lungs: rhonchi bilaterally Heart: regular rate and rhythm, S1, S2 normal, no murmur, click, rub or gallop Abdomen: soft, non-tender; bowel sounds normal; no masses,  no organomegaly Extremities: extremities normal, atraumatic, no cyanosis or edema Pulses: 2+ and symmetric Skin: Skin color, texture, turgor normal. No rashes or lesions Neurologic: Grossly normal  Lab Results:  Recent Labs  07/26/14 0936 07/27/14 0516  WBC 8.3 6.6  HGB 11.3* 11.3*  PLT 175 151    Recent Labs  07/27/14 0516 07/28/14 0625 07/28/14 1534  NA 139 139  --   K 4.2 4.4 4.3  CL 101 100*  --   CO2 31 32  --   GLUCOSE 206* 193*  --   BUN 19 28*  --   CREATININE 0.76 0.85  --     Recent Labs  07/26/14 0936 07/26/14 1316  TROPONINI 0.11* 0.10*   Hepatic Function Panel No results for input(s): PROT, ALBUMIN, AST, ALT, ALKPHOS, BILITOT, BILIDIR, IBILI in the last 72 hours. No results for input(s): CHOL in the last 72 hours. No results for input(s): PROTIME in the last 72 hours.  Imaging: No results found.  Cardiac Studies:  Assessment/Plan:  Arrhythmia Atrial Fibrillation Edema Palpitations Shortness of Breath  Borderline troponins   UTI Hx sepsis . PLAN Agree with floor care Inhalers for sob 02 as necessary Diltiazem/Blockers as needed Continue antibx course DVT prophylaxis as needed Conservative medical therapy  LOS: 5 days    Josmar Messimer D. 07/28/2014, 5:35 PM

## 2014-07-28 NOTE — Plan of Care (Signed)
Problem: Discharge Progression Outcomes Goal: Other Discharge Outcomes/Goals Outcome: Progressing Barriers to progression: none Discharge plan in place: anticipated d/c date unknown at this time Hemodynamically stable: VSS Tolerating diet: fair appetite Activity appropriate: ambulated with physical therapy

## 2014-07-28 NOTE — Plan of Care (Signed)
Problem: Discharge Progression Outcomes Goal: Barriers To Progression Addressed/Resolved Barriers to progression: off unit telemetry pt SR with PVCs this am; pt had 5 beat run of nonsustained VTach at 1438; pt asymptomatic; Dr Winona Legato made aware via telephone call. Discharge plan in place: anticipated d/c date 07/29/14 to [facility] with HHPT, pt already on home O2. Tolerating diet: fair appetite Activity appropriate: OOB with 1 person assist

## 2014-07-29 LAB — GLUCOSE, CAPILLARY
GLUCOSE-CAPILLARY: 163 mg/dL — AB (ref 65–99)
Glucose-Capillary: 163 mg/dL — ABNORMAL HIGH (ref 65–99)
Glucose-Capillary: 285 mg/dL — ABNORMAL HIGH (ref 65–99)
Glucose-Capillary: 273 mg/dL — ABNORMAL HIGH (ref 65–99)

## 2014-07-29 MED ORDER — CARVEDILOL 6.25 MG PO TABS
6.2500 mg | ORAL_TABLET | Freq: Two times a day (BID) | ORAL | Status: DC
  Administered 2014-07-29 – 2014-07-30 (×3): 6.25 mg via ORAL
  Filled 2014-07-29 (×3): qty 1

## 2014-07-29 MED ORDER — METHYLPREDNISOLONE SODIUM SUCC 125 MG IJ SOLR
60.0000 mg | Freq: Two times a day (BID) | INTRAMUSCULAR | Status: DC
  Administered 2014-07-29 – 2014-07-30 (×2): 60 mg via INTRAVENOUS
  Filled 2014-07-29 (×2): qty 2

## 2014-07-29 MED ORDER — LISINOPRIL 5 MG PO TABS
2.5000 mg | ORAL_TABLET | Freq: Every day | ORAL | Status: DC
  Administered 2014-07-29: 22:00:00 2.5 mg via ORAL
  Filled 2014-07-29: qty 1

## 2014-07-29 NOTE — Progress Notes (Signed)
Physical Therapy Treatment Patient Details Name: Denise Duke MRN: [id number] DOB: 12/03/1927 Today's Date: 07/29/2014    History of Present Illness Pt here with hypotension, COPD exacerbation and RVR    PT Comments    Pt progressing with ambulation distance (55 feet with RW) but still limited d/t SOB and fatigue; O2 86% on 3 L/min via nasal cannula end of ambulation but returned to >95% with rest.  Pt modified independent with supine to sit and CGA with transfers for safety (pt rushing to get to commode); pt CGA with ambulation 55 feet with RW (steady but decreased cadence).  Currently recommend 24/7 assist with functional mobility for safety d/t decreased activity tolerance/fatigue with activity (pt reports she can ask for this assist at the ALF).  Follow Up Recommendations  Supervision/Assistance - 24 hour (24/7 assist with functional mobility; HHPT)     Equipment Recommendations       Recommendations for Other Services       Precautions / Restrictions Precautions Precautions: Fall Restrictions Weight Bearing Restrictions: No    Mobility  Bed Mobility Overal bed mobility: Modified Independent             General bed mobility comments: Modified independent supine to sit  Transfers Overall transfer level: Needs assistance Equipment used: Rolling walker (2 wheeled) Transfers: Sit to/from [employer] Sit to Stand: Min guard Stand pivot transfers: Min guard (to bedside commode)       General transfer comment: increased time to perform but steady  Ambulation/Gait Ambulation/Gait assistance: Min guard (2nd assist for supplemental O2 tank) Ambulation Distance (Feet): 55 Feet Assistive device: Rolling walker (2 wheeled)       General Gait Details: Decreased cadence, cautious; no loss of balance; limited d/t SOB.   Stairs            Wheelchair Mobility    Modified Rankin (Stroke Patients Only)       Balance                                     Cognition Arousal/Alertness: Awake/alert Behavior During Therapy: WFL for tasks assessed/performed Overall Cognitive Status: Within Functional Limits for tasks assessed                      Exercises      General Comments  Pt incontinent of urine in bed requiring assist for clean up and pt transferred to commode for bowel movement (NA came in to assist with clean up).      Pertinent Vitals/Pain Pain Assessment: No/denies pain  HR 76-86 bpm during session. O2 on 3 L/min via nasal cannula 96% pre-activity; 86% post ambulation; and 96% with rest (nursing notified).    Home Living                      Prior Function            PT Goals (current goals can now be found in the care plan section) Acute Rehab PT Goals Patient Stated Goal: go back home PT Goal Formulation: With patient Time For Goal Achievement: 08/10/14 Potential to Achieve Goals: Good Progress towards PT goals: Progressing toward goals    Frequency  Min 2X/week    PT Plan Current plan remains appropriate    Co-evaluation             End of Session Equipment Utilized  During Treatment: Gait belt;Oxygen Activity Tolerance:  (Limited d/t SOB and fatigue) Patient left: in chair;with call bell/phone within reach;with chair alarm set     Time: 7867-6720 PT Time Calculation (min) (ACUTE ONLY): 30 min  Charges:  $Gait Training: 8-22 mins $Therapeutic Activity: 8-22 mins                    G CodesHendricks Limes 08/20/14, 4:51 PM Hendricks Limes, PT [phone]

## 2014-07-29 NOTE — Progress Notes (Signed)
Initial Nutrition Assessment  DOCUMENTATION CODES:     INTERVENTION:  Meals and Snacks: Cater to patient preferences; will send foods on Mechanical Soft for ease of chewing Medical Food Supplement Therapy: will send Sugar Free Mighty Shakes BID for added nutrition  NUTRITION DIAGNOSIS:  Inadequate oral intake related to acute illness as evidenced by meal completion < 50%.  GOAL:  Patient will meet greater than or equal to 90% of their needs  MONITOR:  PO intake, Labs, Weight trends, Skin, I & O's  REASON FOR ASSESSMENT:  Other (Comment) (RD Screen, Length of Stay)    ASSESSMENT:  Pt admitted with sepsis and acute renal failure with UTI, new onset afib, vtach last night per MD note with health care acquired pna.  PMHx:  Past Medical History  Diagnosis Date  . CHF (congestive heart failure)   . TIA (transient ischemic attack)   . Anxiety   . COPD (chronic obstructive pulmonary disease)   . Hypertension    PO Intake: Pt reports eating milk and crackers for lunch and bites of breakfast. Pt reports eating poorly since admission. Pt reports eating well PTA with a healthy appetite. Recorded po intake 63% of meals on average since admission.  Medications: Lasix, Novolog, Protonix, KCl, Probiotic Labs: Electrolyte and Renal Profile:   Recent Labs Lab 07/24/14 0330  07/26/14 0936 07/27/14 0516 07/28/14 0625 07/28/14 1534  BUN 25*  < > 15 19 28*  --   CREATININE 0.96  < > 0.89 0.76 0.85  --   NA 139  < > 137 139 139  --   K 3.7  < > 3.7 4.2 4.4 4.3  MG 1.7  --   --   --  2.0 2.0  PHOS  --   --   --   --  3.7  --   < > = values in this interval not displayed. Glucose Profile:  Recent Labs  07/28/14 2217 07/29/14 0720 07/29/14 1135  GLUCAP 212* 163* 285*   Protein Profile: No results for input(s): ALBUMIN in the last 168 hours.   Pt reports UBW of 170lbs.  Height:  Ht Readings from Last 1 Encounters:  07/23/14 4\' 9"  (1.448 m)    Weight:  Wt Readings  from Last 1 Encounters:  07/27/14 172 lb 8 oz (78.245 kg)    Wt Readings from Last 10 Encounters:  07/27/14 172 lb 8 oz (78.245 kg)  07/12/14 177 lb (80.287 kg)    BMI:  Body mass index is 37.32 kg/(m^2).  Diet Order:  Diet 2 gram sodium Room service appropriate?: Yes; Fluid consistency:: Thin Diet - low sodium heart healthy  EDUCATION NEEDS:  No education needs identified at this time   Intake/Output Summary (Last 24 hours) at 07/29/14 1446 Last data filed at 07/29/14 1130  Gross per 24 hour  Intake    480 ml  Output   1250 ml  Net   -770 ml    Last BM:  5/16  LOW Care Level  6/16, RD, LDN Pager [phone]

## 2014-07-29 NOTE — Progress Notes (Signed)
Nursing called pt had 20 beat run Vtach on monitor, then afib RVR briefly and then junctional rhythm with spontaneous return to SR.  Asymptomatic.  No change in therapy at this time.  Cardiology already following, defer to their recommendations.  Kristeen Miss [facility] 07/29/2014, 1:47 AM

## 2014-07-29 NOTE — Plan of Care (Signed)
Problem: Discharge Progression Outcomes Goal: Discharge plan in place and appropriate Individualization:  Pt from [facility] [place] [facility] [place]. Very hard of hearing  Hx of CHF, TIA, Anxiety, COPD & HTN, controlled with home meds Goal: Other Discharge Outcomes/Goals Outcome: Progressing Plan of care progress to goal:  UTI - IV abx infusing. Afebrile. PT working with her. On tele, NSR with PVCs, cardiology following as well. Planning for d/c back to [facility].

## 2014-07-29 NOTE — Clinical Social Work Note (Signed)
CSW is continuing to follow for discharge planning needs. Pt will likely discharge tomorrow, per MD. Pt will return to [facility] with home health services. CSW will continue to follow.   Dede Query, MSW, LCSW Clinical Social Worker [phone]

## 2014-07-29 NOTE — Progress Notes (Signed)
[facility] - [place] at [facility]   PATIENT NAME: Denise Duke    MR#:  [id number]  DATE OF BIRTH:  22-Sep-1927  SUBJECTIVE:  CHIEF COMPLAINT:  Pt is afebrile, unfortunately had 5 beets of Vtach at night, followed by 2 minutes of junctional tachycardia, spontaneously converting to SR, now is in SR at rate 80, feels good, some SOB intermittently. Patient denies any chest pain during my examination, admits of some cough, white sputum. Overall feels good.   REVIEW OF SYSTEMS:  CONSTITUTIONAL: No fever, fatigue or weakness.  EYES: No blurred or double vision.  EARS, NOSE, AND THROAT: No tinnitus or ear pain.  RESPIRATORY: No cough, shortness of breath, wheezing or hemoptysis.  CARDIOVASCULAR: No chest pain, orthopnea, edema.  GASTROINTESTINAL: No nausea, vomiting, diarrhea or abdominal pain.  GENITOURINARY: No dysuria, hematuria.  ENDOCRINE: No polyuria, nocturia,  HEMATOLOGY: No anemia, easy bruising or bleeding SKIN: No rash or lesion. MUSCULOSKELETAL: No joint pain or arthritis.   NEUROLOGIC: No tingling, numbness, weakness.  PSYCHIATRY: No anxiety or depression.   DRUG ALLERGIES:   Allergies  Allergen Reactions  . Aspirin Shortness Of Breath  . Cephalosporins Other (See Comments)    Difficulty walking  . Levaquin [Levofloxacin In D5w] Other (See Comments)    GI Distress, Cramps  . Sulfa Antibiotics Other (See Comments)    unknown    VITALS:  Blood pressure 139/56, pulse 82, temperature 97.4 F (36.3 C), temperature source Oral, resp. rate 20, height 4\' 9"  (1.448 m), weight 78.245 kg (172 lb 8 oz), SpO2 97 %.  PHYSICAL EXAMINATION:  GENERAL:  81 y.o.-year-old patient lying in the bed with no acute distress. No cough, less congested in chest.  EYES: Pupils equal, round, reactive to light and accommodation. No scleral icterus. Extraocular muscles intact.  HEENT: Head atraumatic, normocephalic. Oropharynx and nasopharynx clear.  NECK:  Supple, no  jugular venous distention. No thyroid enlargement, no tenderness.  LUNGS: Good breath sounds, positive crackles on the right posteriorly. No wheezing present. No use of accessory muscles of respiration, unless coughing.  CARDIOVASCULAR: regular, no rubs, or gallops. Distant.  ABDOMEN: Soft, nontender, nondistended. Bowel sounds present. No organomegaly or mass.  EXTREMITIES: No pedal edema, cyanosis, or clubbing.  NEUROLOGIC: Cranial nerves II through XII are intact. Muscle strength 5/5 in all extremities. Sensation intact. Gait not checked.  PSYCHIATRIC: The patient is alert and oriented x 3.  SKIN: No obvious rash, lesion, or ulcer.    LABORATORY PANEL:   CBC  Recent Labs Lab 07/27/14 0516  WBC 6.6  HGB 11.3*  HCT 34.2*  PLT 151   ------------------------------------------------------------------------------------------------------------------  Chemistries   Recent Labs Lab 07/28/14 0625 07/28/14 1534  NA 139  --   K 4.4 4.3  CL 100*  --   CO2 32  --   GLUCOSE 193*  --   BUN 28*  --   CREATININE 0.85  --   CALCIUM 8.0*  --   MG 2.0 2.0   ------------------------------------------------------------------------------------------------------------------  Cardiac Enzymes  Recent Labs Lab 07/26/14 1316  TROPONINI 0.10*   Microbiology report by telephone conversation urine cultures are growing Proteus as well as Klebsiella. Information  received per tubing system just now, it appears that patient's Proteus mirabilis grows more than 100,000 colony forming units,  sensitive to ampicillin, ceftazidime, cefazolin, as well as ceftriaxone,  intermediately sensitive to ciprofloxacin,  sensitive to gentamicin as well as imipenem,  trimethoprim sulfamethoxazole as well as cefoxitin. Klebsiella growth is  30,000 colony-forming units,  resistant to ampicillin, sensitive to ceftazidime, cefazolin, ceftriaxone, ciprofloxacin, gentamicin, imipenem, trimethoprim sulfamethoxazole.   ------------------------------------------------------------------------------------------------------------------  RADIOLOGY:  No results found.  EKG:   Orders placed or performed during the hospital encounter of 07/23/14  . ED EKG  . ED EKG  . EKG 12-Lead  . EKG 12-Lead  . EKG 12-Lead  . EKG 12-Lead    ASSESSMENT AND PLAN:   Acute respiratory distress secondary to healthcare associated pneumonia, acute exacerbation of COPD, new onset atrial fibrillation with RVR, resolved now, dc digoxin and Cardizem restart coreg,  ambulate, watch for symptoms, dc home with HH likely tomorrow.   *New-onset atrial fibrillation with RVR, converted to SR now , resume coreg, dc digoxin.  Patient was hypotensive, now resolved, continue higher dose of Coreg ,  conservative management ,  no intervention recommended by  Dr. Juliann Pares.  *Healthcare associated pneumonia-we will continue IV cefepime and vancomycin for now, . sputum cx not reported. Start IS and repeat chest xray as my suspicion is pt has minimal if any pneumonia, mostly likely pulmonary fibrosis.   *Acute exacerbation of COPD , continue solumedrol for now, dc on tapering doses medrol pack, continue nebulizer treatments on as needed basis.  * vtach, junctional tachycardia, likley due to lung condition, poss duonebs, etc,  resume coreg, higher doses, follow for the next 24 hours, poss dc to assisted living if stable tomorrow  * Septic shock, sepsis and UTI 2/2  Proteus and Klebsiella Clinically better overall,  Start keflex po upon dc home, watching for symptoms  * Acute renal failure. Resolved. Resume lisinopril, off normal saline IV.  * Elevated troponin. Possibly due to septic shock and acute renal failure, follow-up troponin level is not trending and continue aspirin, Plavix and statin.  * Chronic diastolic CHF. Stable,  resumed Lasix   *Gen weakness- PT evaluated, , discharge home with home health likely tomorrow if stable with  coreg   All the records are reviewed and case discussed with Care Management/Social Workerr. Management plans discussed with the patient, family and they are in agreement.  CODE STATUS: DNR  TOTAL  time-40 minutes    Makoto Sellitto M.D on 07/29/2014 at 10:19 AM  Between 7am to 6pm - Pager - [phone] After 6pm go to [email or web address] - password [email or web address] [facility]  [facility]  Office  [phone]  CC: Primary care physician; Rolm Gala, MD

## 2014-07-29 NOTE — Plan of Care (Signed)
Problem: Discharge Progression Outcomes Goal: Other Discharge Outcomes/Goals Outcome: Progressing  Barriers to progression: off unit telemetry pt SR tonight;pt had 20 - 30- beat run of accelerated junctional rhythm then back to SR then  pt had 5 beat run of nonsustained VTach ; pt asymptomatic; Dr Anne Hahn made aware per telephone call. Discharge plan in place: anticipated d/c date 07/29/14 to [facility] with HH[facility], pt already on home O2. Tolerating diet: fair appetite Activity appropriate: OOB with 1 person assist

## 2014-07-30 LAB — GLUCOSE, CAPILLARY
GLUCOSE-CAPILLARY: 182 mg/dL — AB (ref 65–99)
GLUCOSE-CAPILLARY: 246 mg/dL — AB (ref 65–99)

## 2014-07-30 LAB — VANCOMYCIN, TROUGH: Vancomycin Tr: 14 ug/mL (ref 10–20)

## 2014-07-30 LAB — CREATININE, SERUM
Creatinine, Ser: 0.73 mg/dL (ref 0.44–1.00)
GFR calc Af Amer: >60 mL/min (ref >60)
GFR calc non Af Amer: >60 mL/min (ref >60)

## 2014-07-30 MED ORDER — VANCOMYCIN HCL 10 G IV SOLR
1250.0000 mg | INTRAVENOUS | Status: DC
  Filled 2014-07-30: qty 1250

## 2014-07-30 NOTE — Progress Notes (Signed)
ANTIBIOTIC CONSULT NOTE - FOLLOW UP  Pharmacy Consult for Cefepime/Vancomycin Dosing Indication: HCAP, UTI  Allergies  Allergen Reactions  . Aspirin Shortness Of Breath  . Cephalosporins Other (See Comments)    Difficulty walking  . Levaquin [Levofloxacin In D5w] Other (See Comments)    GI Distress, Cramps  . Sulfa Antibiotics Other (See Comments)    unknown    Patient Measurements:   Vital Signs: Temp: 97.6 F (36.4 C) (05/17 0506) Temp Source: Oral (05/17 0506) BP: 127/64 mmHg (05/17 0506) Pulse Rate: 73 (05/17 0506) Intake/Output from previous day: 05/16 0701 - 05/17 0700 In: 720 [P.O.:720] Out: 350 [Urine:350] Intake/Output from this shift:    Labs:  Recent Labs  07/28/14 0625 07/30/14 0515  CREATININE 0.85 0.73   Estimated Creatinine Clearance: 44.2 mL/min (by C-G formula based on Cr of 0.73).  Microbiology: Recent Results (from the past 720 hour(s))  Culture, blood (single)     Status: None (Preliminary result)   Collection Time: 07/11/14 10:34 AM  Result Value Ref Range Status   Micro Text Report   Preliminary       COMMENT                   NO GROWTH IN 48 HOURS   ANTIBIOTIC                                                      Culture, blood (single)     Status: None (Preliminary result)   Collection Time: 07/11/14 10:48 AM  Result Value Ref Range Status   Micro Text Report   Preliminary       COMMENT                   NO GROWTH IN 48 HOURS   ANTIBIOTIC                                                      Blood Culture (routine x 2)     Status: None   Collection Time: 07/23/14 11:25 AM  Result Value Ref Range Status   Specimen Description BLOOD  Final   Special Requests BLOOD  Final   Culture NO GROWTH 5 DAYS  Final   Report Status 07/28/2014 FINAL  Final  Blood Culture (routine x 2)     Status: None   Collection Time: 07/23/14 11:45 AM  Result Value Ref Range Status   Specimen Description BLOOD  Final   Special Requests BLOOD  Final    Culture NO GROWTH 5 DAYS  Final   Report Status 07/28/2014 FINAL  Final  Urine culture     Status: None   Collection Time: 07/23/14 12:15 PM  Result Value Ref Range Status   Specimen Description URINE, RANDOM  Final   Special Requests URINE, RANDOM  Final   Culture   Final    >=100,000 COLONIES/mL PROTEUS MIRABILIS 30,000 COLONIES/ml KLEBSIELLA PNEUMONIAE    Report Status 07/27/2014 FINAL  Final   Organism ID, Bacteria KLEBSIELLA PNEUMONIAE  Final   Organism ID, Bacteria PROTEUS MIRABILIS  Final      Susceptibility   Klebsiella pneumoniae - MIC*    AMPICILLIN  8 RESISTANT Resistant     CEFTAZIDIME <=1 SENSITIVE Sensitive     CEFAZOLIN <=4 SENSITIVE Sensitive     CEFTRIAXONE <=1 SENSITIVE Sensitive     CIPROFLOXACIN <=0.25 SENSITIVE Sensitive     GENTAMICIN <=1 SENSITIVE Sensitive     IMIPENEM <=0.25 SENSITIVE Sensitive     TRIMETH/SULFA <=20 SENSITIVE Sensitive     CEFOXITIN <=4 SENSITIVE Sensitive     * 30,000 COLONIES/ml KLEBSIELLA PNEUMONIAE   Proteus mirabilis - MIC*    AMPICILLIN <=2 SENSITIVE Sensitive     CEFTAZIDIME <=1 SENSITIVE Sensitive     CEFAZOLIN <=4 SENSITIVE Sensitive     CEFTRIAXONE <=1 SENSITIVE Sensitive     CIPROFLOXACIN 2 INTERMEDIATE Intermediate     GENTAMICIN <=1 SENSITIVE Sensitive     IMIPENEM 2 SENSITIVE Sensitive     TRIMETH/SULFA <=20 SENSITIVE Sensitive     CEFOXITIN <=4 SENSITIVE Sensitive     * >=100,000 COLONIES/mL PROTEUS MIRABILIS  Influenza A&B Antigens [facility])     Status: None   Collection Time: 07/23/14 12:25 PM  Result Value Ref Range Status   Influenza A [facility]) NOT DETECTED  Final   Influenza B ([facility]) NOT DETECTED  Final    Anti-infectives    Start     Dose/Rate Route Frequency Ordered Stop   07/28/14 1900  vancomycin (VANCOCIN) IVPB 1000 mg/200 mL premix     1,000 mg 200 mL/hr over 60 Minutes Intravenous Every 18 hours 07/28/14 1509     07/28/14 0000  cephALEXin (KEFLEX) 500 MG capsule     500 mg Oral 3 times daily 07/28/14  1451     07/26/14 1230  vancomycin (VANCOCIN) IVPB 1000 mg/200 mL premix  Status:  Discontinued     1,000 mg 200 mL/hr over 60 Minutes Intravenous Every 12 hours 07/26/14 1154 07/28/14 1456   07/26/14 1000  ceFEPIme (MAXIPIME) 2 g in dextrose 5 % 50 mL IVPB     2 g 100 mL/hr over 30 Minutes Intravenous Every 24 hours 07/25/14 2059     07/24/14 1000  ceFEPIme (MAXIPIME) 1 g in dextrose 5 % 50 mL IVPB  Status:  Discontinued     1 g 100 mL/hr over 30 Minutes Intravenous Every 24 hours 07/23/14 1652 07/25/14 2059   07/23/14 2100  vancomycin (VANCOCIN) IVPB 1000 mg/200 mL premix  Status:  Discontinued     1,000 mg 200 mL/hr over 60 Minutes Intravenous Every 24 hours 07/23/14 1652 07/25/14 1552   07/23/14 1115  ceFEPIme (MAXIPIME) 2 g in dextrose 5 % 50 mL IVPB     2 g 100 mL/hr over 30 Minutes Intravenous  Once 07/23/14 1111 07/23/14 1330   07/23/14 1115  vancomycin (VANCOCIN) IVPB 1000 mg/200 mL premix     1,000 mg 200 mL/hr over 60 Minutes Intravenous  Once 07/23/14 1111 07/23/14 1330     Vancomycin trough 5/15 @ 11:49 = 56mcg/ml Vancomycin trough 5/17 @ 0517 = 14 mcg/ml  Assessment: 81 yo female being treated for Sepsis/UTI as well as HCAP.  Plan: Current orders for  vancomycin 1gm IV Q18H for target trough of 15-19mcg/ml. Will increase dose to vancomycin 1250mg  IV Q18H.  Continue cefepime 2g IV q24hr.  Pharmacy will continue to monitor and adjust per consult.    , PharmD  07/30/2014,7:33 AM

## 2014-07-30 NOTE — Plan of Care (Addendum)
Problem: Discharge Progression Outcomes Goal: Discharge plan in place and appropriate Individualization:  Outcome: Adequate for Discharge Pt from [facility] Goes by Denise Duke. Very hard of hearing   Hx of CHF, TIA, Anxiety, COPD & HTN, controlled with home meds Goal: Other Discharge Outcomes/Goals Outcome: Progressing Pt discharged per MD order. IV removed. Discharge packet reviewed with patient and caregiver, Marylene Land. Pt left via wheelchair with nursing staff.

## 2014-07-30 NOTE — Progress Notes (Signed)
[facility] [place] at [facility]   PATIENT NAME: Denise Duke    MR#:  [id number]  DATE OF BIRTH:  13-Sep-1927  SUBJECTIVE:  CHIEF COMPLAINT:  Pt is afebrile, unfortunately again had 4 beets of Vtach at night, back to SR at rate 75, feels good, still some SOB , chronic, on 3 liters O2 per  [place], which is baseline . Patient denies any chest pain during my examination, admits of some cough, no sputum. Overall feels good.   REVIEW OF SYSTEMS:  CONSTITUTIONAL: No fever, fatigue or weakness.  EYES: No blurred or double vision.  EARS, NOSE, AND THROAT: No tinnitus or ear pain.  RESPIRATORY: No cough, shortness of breath, wheezing or hemoptysis.  CARDIOVASCULAR: No chest pain, orthopnea, edema.  GASTROINTESTINAL: No nausea, vomiting, diarrhea or abdominal pain.  GENITOURINARY: No dysuria, hematuria.  ENDOCRINE: No polyuria, nocturia,  HEMATOLOGY: No anemia, easy bruising or bleeding SKIN: No rash or lesion. MUSCULOSKELETAL: No joint pain or arthritis.   NEUROLOGIC: No tingling, numbness, weakness.  PSYCHIATRY: No anxiety or depression.   DRUG ALLERGIES:   Allergies  Allergen Reactions  . Aspirin Shortness Of Breath  . Cephalosporins Other (See Comments)    Difficulty walking  . Levaquin [Levofloxacin In D5w] Other (See Comments)    GI Distress, Cramps  . Sulfa Antibiotics Other (See Comments)    unknown    VITALS:  Blood pressure 135/67, pulse 75, temperature 97.3 F (36.3 C), temperature source Oral, resp. rate 18, height 4\' 9"  (1.448 m), weight 78.245 kg (172 lb 8 oz), SpO2 99 %.  PHYSICAL EXAMINATION:  GENERAL:  81 y.o.-year-old patient lying in the bed with no acute distress. No cough, less congested in chest.  EYES: Pupils equal, round, reactive to light and accommodation. No scleral icterus. Extraocular muscles intact.  HEENT: Head atraumatic, normocephalic. Oropharynx and nasopharynx clear.  NECK:  Supple, no jugular venous distention. No thyroid  enlargement, no tenderness.  LUNGS: Good breath sounds, positive rhonchi on the left posteriorly. No wheezing present. No use of accessory muscles of respiration, some dry coughing.  CARDIOVASCULAR: regular, no rubs, or gallops. Distant.  ABDOMEN: Soft, nontender, nondistended. Bowel sounds present. No organomegaly or mass.  EXTREMITIES: No pedal edema, cyanosis, or clubbing.  NEUROLOGIC: Cranial nerves II through XII are intact. Muscle strength 5/5 in all extremities. Sensation intact. Gait not checked.  PSYCHIATRIC: The patient is alert and oriented x 3.  SKIN: No obvious rash, lesion, or ulcer.    LABORATORY PANEL:   CBC  Recent Labs Lab 07/27/14 0516  WBC 6.6  HGB 11.3*  HCT 34.2*  PLT 151   ------------------------------------------------------------------------------------------------------------------  Chemistries   Recent Labs Lab 07/28/14 0625 07/28/14 1534 07/30/14 0515  NA 139  --   --   K 4.4 4.3  --   CL 100*  --   --   CO2 32  --   --   GLUCOSE 193*  --   --   BUN 28*  --   --   CREATININE 0.85  --  0.73  CALCIUM 8.0*  --   --   MG 2.0 2.0  --    ------------------------------------------------------------------------------------------------------------------  Cardiac Enzymes  Recent Labs Lab 07/26/14 1316  TROPONINI 0.10*   Microbiology report by telephone conversation urine cultures are growing Proteus as well as Klebsiella. Information  received per tubing system just now, it appears that patient's Proteus mirabilis grows more than 100,000 colony forming units,  sensitive to ampicillin, ceftazidime, cefazolin, as  well as ceftriaxone,  intermediately sensitive to ciprofloxacin,  sensitive to gentamicin as well as imipenem,  trimethoprim sulfamethoxazole as well as cefoxitin. Klebsiella growth is  30,000 colony-forming units, resistant to ampicillin, sensitive to ceftazidime, cefazolin, ceftriaxone, ciprofloxacin, gentamicin, imipenem, trimethoprim  sulfamethoxazole.  ------------------------------------------------------------------------------------------------------------------  RADIOLOGY:  No results found.  EKG:   Orders placed or performed during the hospital encounter of 07/23/14  . ED EKG  . ED EKG  . EKG 12-Lead  . EKG 12-Lead  . EKG 12-Lead  . EKG 12-Lead    ASSESSMENT AND PLAN:   Acute respiratory distress secondary to healthcare associated pneumonia, acute exacerbation of COPD, new onset atrial fibrillation with RVR, resolved now, off digoxin and Cardizem , back on coreg,  Higher doses,dc home with [facility] today.   *New-onset atrial fibrillation with RVR, converted to SR now , continue coreg, discussed with cardiologist DR [facility]  over phone today.  Patient was hypotensive, now resolved, continue higher dose of Coreg ,  conservative management ,  no intervention recommended by  Dr. Juliann Pares.  *Healthcare associated pneumonia, received cefepime and vancomycin IV for 7 days, dc now, . sputum cx not reported. Continue IS and repeat chest xray today as my suspicion is that  pt had minimal if any pneumonia, mostly likely pulmonary fibrosis.   *Acute exacerbation of COPD ,  taper medrol pack, continue nebulizer treatments on as needed basis.  * vtach, junctional tachycardia, likley due to lung condition, poss duonebs, etc.  Continue coreg, higher doses,  Dc to  assisted living today, no intervention per cardiology , follow up with DR [facility] in 2-3 days.   * Septic shock, sepsis and UTI 2/2  Proteus and Klebsiella Clinically better overall,  Continue keflex po for 10  more days to complete 14 days course  * Acute renal failure. Resolved. Resumed lisinopril, off normal saline IV.  * Elevated troponin. Possibly due to septic shock and acute renal failure, follow-up troponin level is not trending and continue aspirin, Plavix and statin.  * Chronic diastolic CHF. Stable,  resumed Lasix   *Gen weakness- PT evaluated, ,  discharge home with home health today   All the records are reviewed and case discussed with Care Management/Social Workerr. Management plans discussed with the patient, family and they are in agreement.  CODE STATUS: DNR  TOTAL  time-40 minutes    Nicolai Labonte M.D on 07/30/2014 at 10:06 AM  Between 7am to 6pm - Pager - [phone] After 6pm go to [email or web address] - password [email or web address] [facility]  [facility]  Office  [phone]  CC: Primary care physician; Rolm Gala, MD

## 2014-07-30 NOTE — Progress Notes (Addendum)
Patient for d/c today to ALF bed at [facility] as prior to admission. Patient's sister, Gigi Gin and  Sheralyn Boatman (granddaughter)  agreeable to this plan- plan transfer via EMS. Reece Levy, MSW, Theresia Majors

## 2014-07-30 NOTE — Plan of Care (Signed)
Problem: Discharge Progression Outcomes Goal: Barriers To Progression Addressed/Resolved Outcome: Progressing Individualization:   Pt from [facility] Goes by Denise Duke. Very hard of hearing   Hx of CHF, TIA, Anxiety, COPD & HTN, controlled with home meds Goal: Other Discharge Outcomes/Goals Outcome: Progressing Patient is alert and oriented, no c/o pain at this time.Telemetry is SR with PVCs and first degree HB. Resting quietly.

## 2014-07-30 NOTE — Discharge Instructions (Signed)
Antibiotic Medication Antibiotic medicine helps fight germs. Germs cause infections. This type of medicine will not work for colds, flu, or other viral infections. Tell your doctor if you:  Are allergic to any medicines.  Are pregnant or are trying to get pregnant.  Are taking other medicines.  Have other medical problems. HOME CARE  Take your medicine with a glass of water or food as told by your doctor.  Take the medicine as told. Finish them even if you start to feel better.  Do not give your medicine to other people.  Do not use your medicine in the future for a different infection.  Ask your doctor about which side effects to watch for.  Try not to miss any doses. If you miss a dose, take it as soon as possible. If it is almost time for your next dose, and your dosing schedule is:  Two doses a day, take the missed dose and the next dose 5 to 6 hours later.  Three or more doses a day, take the missed dose and the next dose 2 to 4 hours later, or double your next dose.  Then go back to your normal schedule. GET HELP RIGHT AWAY IF:   You get worse or do not get better within a few days.  The medicine makes you sick.  You develop a rash or any other side effects.  You have questions or concerns. MAKE SURE YOU:  Understand these instructions.  Will watch your condition.  Will get help right away if you are not doing well or get worse. Document Released: 12/09/2007 Document Revised: 05/24/2011 Document Reviewed: 02/04/2009 [facility] Patient Information 2015 [place], [place]. This information is not intended to replace advice given to you by your health care provider. Make sure you discuss any questions you have with your health care provider.  Bacteremia Bacteremia occurs when bacteria get in your blood. Normal blood does not usually have bacteria. Bacteremia is one way infections can spread from one part of the body to another. CAUSES   Causes may include anything  that allows bacteria to get into the body. Examples are:  Catheters.  Intravenous (IV) access tubes.  Cuts or scrapes of the skin.  Temporary bacteremia may occur during dental procedures, while brushing your teeth, or during a bowel movement. This rarely causes any symptoms or medical problems.  Bacteria may also get in the bloodstream as a complication of a bacterial infection elsewhere. This includes infected wounds and bacterial infections of the:  Lungs (pneumonia).  Kidneys (pyelonephritis).  Intestines (enteritis, colitis).  Organs in the abdomen (appendicitis, cholecystitis, diverticulitis). SYMPTOMS  The body is usually able to clear small numbers of bacteria out of the blood quickly. Brief bacteremia usually does not cause problems.   Problems can occur if the bacteria start to grow in number or spread to other parts of the body. If the bacteria start growing, you may develop:  Chills.  Fever.  Nausea.  Vomiting.  Sweating.  Lightheadedness and low blood pressure.  Pain.  If bacteria start to grow in the linings around the brain, it is called meningitis. This can cause severe headaches, many other problems, and even death.  If bacteria start to grow in a joint, it causes arthritis with painful joints. If bacteria start to grow in a bone, it is called osteomyelitis.  Bacteria from the blood can also cause sores (abscesses) in many organs, such as the muscle, liver, spleen, lungs, brain, and kidneys. DIAGNOSIS   This  condition is diagnosed by cultures of the blood.  Cultures may also be taken from other parts of the body that are thought to be causing the bacteremia. A small piece of tissue, fluid, or other product of the body is sampled. The sample is then put on a growth plate to see if any bacteria grows.  Other lab tests may be done and the results may be abnormal. TREATMENT  Treatment requires a stay in the hospital. You will be given antibiotic  medicine through an IV access tube. PREVENTION  People with an increased risk of developing bacteremia or complications may be given antibiotics before certain procedures. Examples are:  A person with a heart murmur or artificial heart valve, before having his or her teeth cleaned.  Before having a surgical or other invasive procedure.  Before having a bowel procedure. Document Released: 12/13/2005 Document Revised: 05/24/2011 Document Reviewed: 09/24/2010 [facility] Patient Information 2015 [place], [place]. This information is not intended to replace advice given to you by your health care provider. Make sure you discuss any questions you have with your health care provider.  Urinary Tract Infection A urinary tract infection (UTI) can occur any place along the urinary tract. The tract includes the kidneys, ureters, bladder, and urethra. A type of germ called bacteria often causes a UTI. UTIs are often helped with antibiotic medicine.  HOME CARE   If given, take antibiotics as told by your doctor. Finish them even if you start to feel better.  Drink enough fluids to keep your pee (urine) clear or pale yellow.  Avoid tea, drinks with caffeine, and bubbly (carbonated) drinks.  Pee often. Avoid holding your pee in for a long time.  Pee before and after having sex (intercourse).  Wipe from front to back after you poop (bowel movement) if you are a woman. Use each tissue only once. GET HELP RIGHT AWAY IF:   You have back pain.  You have lower belly (abdominal) pain.  You have chills.  You feel sick to your stomach (nauseous).  You throw up (vomit).  Your burning or discomfort with peeing does not go away.  You have a fever.  Your symptoms are not better in 3 days. MAKE SURE YOU:   Understand these instructions.  Will watch your condition.  Will get help right away if you are not doing well or get worse. Document Released: 08/18/2007 Document Revised: 11/24/2011 Document  Reviewed: 09/30/2011 [facility] Patient Information 2015 [place], [place]. This information is not intended to replace advice given to you by your health care provider. Make sure you discuss any questions you have with your health care provider.

## 2014-07-30 NOTE — Care Management (Signed)
Patient discharging back to [facility] today followed by [facility]. She is chronically on O2 through [facility] per Liborio Nixon at [facility] and has a rolling walker she ambulates with. Sherrilyn Rist with [facility] updated on [facility] request and agrees. No further RNCM needs. Marylu Lund CSW to assist with transfer back to ALF.

## 2014-07-30 NOTE — Progress Notes (Signed)
Inpatient Diabetes Program Recommendations  AACE/ADA: New Consensus Statement on Inpatient Glycemic Control (2013)  Target Ranges:  Prepandial:   less than 140 mg/dL      Peak postprandial:   less than 180 mg/dL (1-2 hours)      Critically ill patients:  140 - 180 mg/dL   Results for CHANEQUA, SPEES (MRN [id number]) as of 07/30/2014 12:02  Ref. Range 07/29/2014 11:35 07/29/2014 16:22 07/29/2014 21:51 07/30/2014 07:15 07/30/2014 11:09  Glucose-Capillary Latest Ref Range: 65-99 mg/dL 242 (H) 683 (H) 419 (H) 182 (H) 246 (H)   Reason for assessment: elevated CBG  Diabetes history: Type 2 Outpatient Diabetes medications: none Current orders for Inpatient glycemic control: Novolog correction sensitive scale tid and hs  Recent A1C in the chart is 5.9% on 05/31/14.   Recent in-hospital elevated blood sugars likely as a result of infection and steroids.  May want to consider increasing the Novolog to the moderate scale tid and hs;  post meal blood sugars are elevated despite Novolog sensitive correction.   Would d/c her from hospital on Novolog moderate correction as long as blood sugars remain elevated and patient continues on steroids.    Susette Racer, RN, BA, MHA, CDE Diabetes Coordinator Inpatient Diabetes Program  [phone] (Team Pager) [phone] [facility] Office) 07/30/2014 12:13 PM

## 2014-08-01 NOTE — Discharge Summary (Signed)
[facility] - [place] at [facility]   PATIENT NAME: Denise Duke    MR#:  [id number]  DATE OF BIRTH:  1927-07-04  DATE OF ADMISSION:  07/23/2014 ADMITTING PHYSICIAN: Shaune Pollack, MD  DATE OF DISCHARGE: 07/30/2014  2:56 PM  PRIMARY CARE PHYSICIAN: Rolm Gala, MD     ADMISSION DIAGNOSIS:  Acute UTI [N39.0] Sepsis, due to unspecified organism [A41.9]  DISCHARGE DIAGNOSIS:  Principal Problem:   UTI (lower urinary tract infection) Active Problems:   Sepsis   ARF (acute renal failure)   Septic shock   Acute respiratory distress   Pneumonia   Atrial fibrillation   COPD exacerbation   SECONDARY DIAGNOSIS:   Past Medical History  Diagnosis Date  . CHF (congestive heart failure)   . TIA (transient ischemic attack)   . Anxiety   . COPD (chronic obstructive pulmonary disease)   . Hypertension     .pro HOSPITAL COURSE:   Denise Duke is a 81 y.o. female with a known history of CHF, COPD, hypertension and TIA. Patient was sent to the ED from nursing home due to generalized weakness. The patient is alert, awake and oriented. The patient complains of a generalized weakness, mild shortness breath and dehydration. But the patient denies any fever, chills, chest pain or palpitation. The patient was found to have a UTI and tachycardia.  Chest x-ray done on 07/26/2014 revealed cardiomegaly, background pulmonary fibrosis as well as worsening density in bases concerning for possible pneumonia . The patient's blood pressure decreased to 80 over 70, she is being treated with a normal saline bolus now. She is also treated with the cefepime and vancomycin ED influenza A and B antigens were taken and those were negative. Sputum cultures were sent and doses are not reported. Blood cultures taken on the tendons of May 2016 showed no growth. Urine culture collected on the 10th of May 2016 revealed more than 100,000 colony forming units of Proteus mirabilis and 30,000 colony  forming units of Klebsiella pneumonia.  Klebsiella was resistant to ampicillin, sensitive to all other antibiotics, Proteus mirabilis was sensitive to all antibiotics but intermediately to ciprofloxacin. She had a somewhat complicated course of her illness in the hospital , she developed atrial fibrillation with RVR came hypotensive and required to be transferred to intensive care unit for therapy on 07/26/2014. With IV fluid administration, however, as well as digoxin IV. for. A. fib. she improved and was transferred back to regular floor. She however had few episodes of nonsustained V. tach as well as junctional tachycardia and PVCs intermittently. She, however, was asymptomatic with all those episodes. She was recommended to be continued on beta blocker, Coreg, dose of which was advanced during her stay in the hospital time. She converted into sinus rhythm. No anticoagulation was recommended by cardiologist, or any other interventions at this time. Discussion by a problem 1. Acute respiratory distress secondary to healthcare associated pneumonia, acute exacerbation of COPD, new onset atrial fibrillation with RVR, resolved now  2. New-onset atrial fibrillation with RVR, in sinus rhythm. Again, continue Coreg at the higher doses. No anticoagulation or any other intervention was recommended by Dr. Juliann Pares.  3. Healthcare associated pneumonia-finished 7 day therapy with IV cefepime and vancomycin. Continue tapering steroids  4. Acute exacerbation of COPD continue tapering steroids as well as therapy with inhalers, Advair  5 Septic shock, due to UTI 2/2 Proteus and Klebsiella. Continue Keflex to complete a course  6. Acute renal failure. Resolved. Resume lisinopril, follow kidney  function as outpatient  7. Elevated troponin. Likely due to septic shock and acute renal failure, continue aspirin, Plavix and statin.  8. Chronic diastolic CHF. Stable, resumed Lasix   9. Gen weakness- PT eval, home  health physical therapy and RN was recommended. Will prescribe it on discharge DISCHARGE CONDITIONS:  Fair  CONSULTS OBTAINED:  Treatment Team:  Alwyn Pea, MD Delfino Lovett, MD  DRUG ALLERGIES:   Allergies  Allergen Reactions  . Aspirin Shortness Of Breath  . Cephalosporins Other (See Comments)    Difficulty walking  . Levaquin [Levofloxacin In D5w] Other (See Comments)    GI Distress, Cramps  . Sulfa Antibiotics Other (See Comments)    unknown    DISCHARGE MEDICATIONS:   Discharge Medication List as of 07/30/2014 12:43 PM    START taking these medications   Details  cephALEXin (KEFLEX) 500 MG capsule Take 1 capsule (500 mg total) by mouth 3 (three) times daily., Starting 07/28/2014, Until Discontinued, Print    methylPREDNISolone (MEDROL DOSEPAK) 4 MG TBPK tablet follow package directions, Normal      CONTINUE these medications which have CHANGED   Details  carvedilol (COREG) 6.25 MG tablet Take 1 tablet (6.25 mg total) by mouth 2 (two) times daily with a meal., Starting 07/28/2014, Until Discontinued, Print    clonazePAM (KLONOPIN) 0.5 MG tablet Take 1 tablet (0.5 mg total) by mouth 3 (three) times daily as needed (nervousness)., Starting 07/28/2014, Until Discontinued, Print    !! traMADol (ULTRAM) 50 MG tablet Take 1 tablet (50 mg total) by mouth 2 (two) times daily as needed for moderate pain (pain)., Starting 07/28/2014, Until Discontinued, Print     !! - Potential duplicate medications found. Please discuss with provider.    CONTINUE these medications which have NOT CHANGED   Details  acetaminophen (TYLENOL) 500 MG tablet Take 500 mg by mouth 2 (two) times daily as needed (pain)., Until Discontinued, Historical Med    albuterol (PROVENTIL) (2.5 MG/3ML) 0.083% nebulizer solution Take 3 mLs by nebulization every 6 (six) hours as needed for wheezing., Until Discontinued, Historical Med    alendronate (FOSAMAX) 70 MG tablet Take 70 mg by mouth once a week. Take  with a full glass of water on an empty stomach., Until Discontinued, Historical Med    atorvastatin (LIPITOR) 80 MG tablet Take 80 mg by mouth at bedtime., Until Discontinued, Historical Med    Calcium Carbonate-Vitamin D 600-400 MG-UNIT per tablet Take 1 tablet by mouth 2 (two) times daily., Until Discontinued, Historical Med    capsaicin (ZOSTRIX) 0.025 % cream Apply topically 2 (two) times daily. Apply topically to affected area 2 times a day, as needed for pain, Until Discontinued, Historical Med    celecoxib (CELEBREX) 200 MG capsule Take 200 mg by mouth daily as needed (as needed for pain)., Until Discontinued, Historical Med    clopidogrel (PLAVIX) 75 MG tablet Take 75 mg by mouth daily., Until Discontinued, Historical Med    CRANBERRY EXTRACT PO Take 425 mg by mouth daily. Cranberry tablet, Until Discontinued, Historical Med    escitalopram (LEXAPRO) 10 MG tablet Take 10 mg by mouth daily., Until Discontinued, Historical Med    Fluticasone-Salmeterol (ADVAIR) 500-50 MCG/DOSE AEPB Inhale 1 puff into the lungs 2 (two) times daily., Until Discontinued, Historical Med    furosemide (LASIX) 20 MG tablet Take 20 mg by mouth daily., Until Discontinued, Historical Med    gabapentin (NEURONTIN) 600 MG tablet Take 600 mg by mouth 3 (three) times daily., Until Discontinued,  Historical Med    Ipratropium-Albuterol (COMBIVENT) 20-100 MCG/ACT AERS respimat Inhale 1 puff into the lungs 4 (four) times daily., Until Discontinued, Historical Med    isosorbide mononitrate (IMDUR) 30 MG 24 hr tablet Take 30 mg by mouth daily., Until Discontinued, Historical Med    leflunomide (ARAVA) 20 MG tablet Take 10 mg by mouth every morning. Take half tab of 20mg , Until Discontinued, Historical Med    lisinopril (PRINIVIL,ZESTRIL) 2.5 MG tablet Take 1.25 mg by mouth daily. Take half tab of 2.5mg , Until Discontinued, Historical Med    loperamide (IMODIUM) 2 MG capsule Take by mouth 4 (four) times daily as  needed for diarrhea or loose stools., Until Discontinued, Historical Med    montelukast (SINGULAIR) 10 MG tablet Take 10 mg by mouth at bedtime., Until Discontinued, Historical Med    pantoprazole (PROTONIX) 40 MG tablet Take 40 mg by mouth every morning., Until Discontinued, Historical Med    potassium chloride (K-DUR) 10 MEQ tablet Take 10 mEq by mouth daily., Until Discontinued, Historical Med    predniSONE (DELTASONE) 5 MG tablet Take 5 mg by mouth 2 (two) times daily with a meal., Until Discontinued, Historical Med    pregabalin (LYRICA) 25 MG capsule Take 25 mg by mouth 3 (three) times daily., Until Discontinued, Historical Med    Probiotic Product (PROBIOTIC FORMULA PO) Take by mouth every morning. Probiotic Formula-oral cap, Until Discontinued, Historical Med    !! traMADol (ULTRAM) 50 MG tablet Take 50 mg by mouth 2 (two) times daily as needed (pain)., Until Discontinued, Historical Med    albuterol-ipratropium (COMBIVENT) 18-103 MCG/ACT inhaler Inhale into the lungs every 4 (four) hours., Until Discontinued, Historical Med    calcium carbonate (OS-CAL) 600 MG TABS tablet Take 600 mg by mouth 2 (two) times daily with a meal., Until Discontinued, Historical Med     !! - Potential duplicate medications found. Please discuss with provider.       DISCHARGE INSTRUCTIONS:    Follow-up with her primary care physician in the next few days after discharge, Dr. within next 1-2 days after discharge If you experience worsening of your admission symptoms, develop shortness of breath, life threatening emergency, suicidal or homicidal thoughts you must seek medical attention immediately by calling 911 or calling your MD immediately  if symptoms less severe.  You Must read complete instructions/literature along with all the possible adverse reactions/side effects for all the Medicines you take and that have been prescribed to you. Take any new Medicines after you have completely  understood and accept all the possible adverse reactions/side effects.   Please note  You were cared for by a hospitalist during your hospital stay. If you have any questions about your discharge medications or the care you received while you were in the hospital after you are discharged, you can call the unit and asked to speak with the hospitalist on call if the hospitalist that took care of you is not available. Once you are discharged, your primary care physician will handle any further medical issues. Please note that NO REFILLS for any discharge medications will be authorized once you are discharged, as it is imperative that you return to your primary care physician (or establish a relationship with a primary care physician if you do not have one) for your aftercare needs so that they can reassess your need for medications and monitor your lab values.    Today   CHIEF COMPLAINT:   Chief Complaint  Patient presents with  .  Shortness of Breath    HISTORY OF PRESENT ILLNESS:  Adelaine Ficke  is a 81 y.o. female with a known history of CHF as well as COPD presents to the hospital due to generalized weakness as well as mild shortness of breath and feeling dehydrated, patient's blood pressure was noted to be low 70s to 80s. She was noted to be in to have urinary tract infection. She was admitted to the hospital and her chest x-ray later in the stay in the hospital time was also concerning for possible pneumonia. Patient was treated with broad-spectrum antibiotic therapy and improved. She is to continue antibiotics for a few more days to complete course for UTI. She is to continue steroid taper. Patient was noted to have atrial fibrillation while she was in the hospital as well as PVCs and junctional tachycardia, for which Coreg dose was advanced per cardiology recommendations. She is to follow up with cardiology as outpatient as well as her primary care physician .   VITAL SIGNS:  Blood pressure  125/91, pulse 80, temperature 98.4 F (36.9 C), temperature source Oral, resp. rate 18, height 4\' 9"  (1.448 m), weight 78.245 kg (172 lb 8 oz), SpO2 99 %.  I/O:  No intake or output data in the 24 hours ending 08/01/14 1659  PHYSICAL EXAMINATION:  GENERAL:  81 y.o.-year-old patient lying in the bed with no acute distress.  EYES: Pupils equal, round, reactive to light and accommodation. No scleral icterus. Extraocular muscles intact.  HEENT: Head atraumatic, normocephalic. Oropharynx and nasopharynx clear.  NECK:  Supple, no jugular venous distention. No thyroid enlargement, no tenderness.  LUNGS: Normal breath sounds bilaterally, no wheezing, rales,rhonchi or crepitation. No use of accessory muscles of respiration.  CARDIOVASCULAR: S1, S2 normal. No murmurs, rubs, or gallops.  ABDOMEN: Soft, non-tender, non-distended. Bowel sounds present. No organomegaly or mass.  EXTREMITIES: No pedal edema, cyanosis, or clubbing.  NEUROLOGIC: Cranial nerves II through XII are intact. Muscle strength 5/5 in all extremities. Sensation intact. Gait not checked.  PSYCHIATRIC: The patient is alert and oriented x 3.  SKIN: No obvious rash, lesion, or ulcer.   DATA REVIEW:   CBC  Recent Labs Lab 07/27/14 0516  WBC 6.6  HGB 11.3*  HCT 34.2*  PLT 151    Chemistries   Recent Labs Lab 07/28/14 0625 07/28/14 1534 07/30/14 0515  NA 139  --   --   K 4.4 4.3  --   CL 100*  --   --   CO2 32  --   --   GLUCOSE 193*  --   --   BUN 28*  --   --   CREATININE 0.85  --  0.73  CALCIUM 8.0*  --   --   MG 2.0 2.0  --     Cardiac Enzymes  Recent Labs Lab 07/26/14 1316  TROPONINI 0.10*    Microbiology Results  Results for orders placed or performed during the hospital encounter of 07/23/14  Blood Culture (routine x 2)     Status: None   Collection Time: 07/23/14 11:25 AM  Result Value Ref Range Status   Specimen Description BLOOD  Final   Special Requests BLOOD  Final   Culture NO GROWTH 5  DAYS  Final   Report Status 07/28/2014 FINAL  Final  Blood Culture (routine x 2)     Status: None   Collection Time: 07/23/14 11:45 AM  Result Value Ref Range Status   Specimen Description BLOOD  Final  Special Requests BLOOD  Final   Culture NO GROWTH 5 DAYS  Final   Report Status 07/28/2014 FINAL  Final  Urine culture     Status: None   Collection Time: 07/23/14 12:15 PM  Result Value Ref Range Status   Specimen Description URINE, RANDOM  Final   Special Requests URINE, RANDOM  Final   Culture   Final    >=100,000 COLONIES/mL PROTEUS MIRABILIS 30,000 COLONIES/ml KLEBSIELLA PNEUMONIAE    Report Status 07/27/2014 FINAL  Final   Organism ID, Bacteria KLEBSIELLA PNEUMONIAE  Final   Organism ID, Bacteria PROTEUS MIRABILIS  Final      Susceptibility   Klebsiella pneumoniae - MIC*    AMPICILLIN 8 RESISTANT Resistant     CEFTAZIDIME <=1 SENSITIVE Sensitive     CEFAZOLIN <=4 SENSITIVE Sensitive     CEFTRIAXONE <=1 SENSITIVE Sensitive     CIPROFLOXACIN <=0.25 SENSITIVE Sensitive     GENTAMICIN <=1 SENSITIVE Sensitive     IMIPENEM <=0.25 SENSITIVE Sensitive     TRIMETH/SULFA <=20 SENSITIVE Sensitive     CEFOXITIN <=4 SENSITIVE Sensitive     * 30,000 COLONIES/ml KLEBSIELLA PNEUMONIAE   Proteus mirabilis - MIC*    AMPICILLIN <=2 SENSITIVE Sensitive     CEFTAZIDIME <=1 SENSITIVE Sensitive     CEFAZOLIN <=4 SENSITIVE Sensitive     CEFTRIAXONE <=1 SENSITIVE Sensitive     CIPROFLOXACIN 2 INTERMEDIATE Intermediate     GENTAMICIN <=1 SENSITIVE Sensitive     IMIPENEM 2 SENSITIVE Sensitive     TRIMETH/SULFA <=20 SENSITIVE Sensitive     CEFOXITIN <=4 SENSITIVE Sensitive     * >=100,000 COLONIES/mL PROTEUS MIRABILIS  Influenza A&B Antigens [facility])     Status: None   Collection Time: 07/23/14 12:25 PM  Result Value Ref Range Status   Influenza A ([facility]) NOT DETECTED  Final   Influenza B ([facility]) NOT DETECTED  Final    RADIOLOGY:  No results found.  EKG:   Orders placed or performed  during the hospital encounter of 07/23/14  . ED EKG  . ED EKG  . EKG 12-Lead  . EKG 12-Lead  . EKG 12-Lead  . EKG 12-Lead  . EKG      Management plans discussed with the patient, family and they are in agreement.  CODE STATUS:  Advance Directive Documentation        Most Recent Value   Type of Advance Directive  Out of facility DNR (pink MOST or yellow form)   Pre-existing out of facility DNR order (yellow form or pink MOST form)  Yellow form placed in chart (order not valid for inpatient use)   "MOST" Form in Place?        TOTAL TIME TAKING CARE OF THIS PATIENT: 50 minutes.    Katharina Caper M.D on 08/01/2014 at 4:59 PM  Between 7am to 6pm - Pager - [phone]  After 6pm go to [email or web address] - password [email or web address] [facility]  [facility]  Office  [phone]  CC: Primary care physician; Rolm Gala, MD

## 2014-08-05 ENCOUNTER — Encounter: Payer: Self-pay | Admitting: Emergency Medicine

## 2014-08-05 ENCOUNTER — Emergency Department: Payer: Medicare Other

## 2014-08-05 ENCOUNTER — Inpatient Hospital Stay
Admission: EM | Admit: 2014-08-05 | Discharge: 2014-08-07 | DRG: 871 | Disposition: A | Discharge status: Skilled Nursing Facility | Payer: Medicare Other | Attending: Internal Medicine | Admitting: Internal Medicine

## 2014-08-05 DIAGNOSIS — M069 Rheumatoid arthritis, unspecified: Secondary | ICD-10-CM | POA: Diagnosis present

## 2014-08-05 DIAGNOSIS — Z720 Tobacco use: Secondary | ICD-10-CM

## 2014-08-05 DIAGNOSIS — I5032 Chronic diastolic (congestive) heart failure: Secondary | ICD-10-CM | POA: Diagnosis present

## 2014-08-05 DIAGNOSIS — R748 Abnormal levels of other serum enzymes: Secondary | ICD-10-CM | POA: Diagnosis present

## 2014-08-05 DIAGNOSIS — Y939 Activity, unspecified: Secondary | ICD-10-CM

## 2014-08-05 DIAGNOSIS — Y92199 Unspecified place in other specified residential institution as the place of occurrence of the external cause: Secondary | ICD-10-CM | POA: Diagnosis not present

## 2014-08-05 DIAGNOSIS — Z66 Do not resuscitate: Secondary | ICD-10-CM | POA: Diagnosis present

## 2014-08-05 DIAGNOSIS — F419 Anxiety disorder, unspecified: Secondary | ICD-10-CM | POA: Diagnosis present

## 2014-08-05 DIAGNOSIS — N390 Urinary tract infection, site not specified: Secondary | ICD-10-CM | POA: Diagnosis present

## 2014-08-05 DIAGNOSIS — J9620 Acute and chronic respiratory failure, unspecified whether with hypoxia or hypercapnia: Secondary | ICD-10-CM | POA: Diagnosis present

## 2014-08-05 DIAGNOSIS — I4891 Unspecified atrial fibrillation: Secondary | ICD-10-CM | POA: Diagnosis present

## 2014-08-05 DIAGNOSIS — K589 Irritable bowel syndrome without diarrhea: Secondary | ICD-10-CM | POA: Diagnosis present

## 2014-08-05 DIAGNOSIS — R778 Other specified abnormalities of plasma proteins: Secondary | ICD-10-CM

## 2014-08-05 DIAGNOSIS — Z882 Allergy status to sulfonamides status: Secondary | ICD-10-CM

## 2014-08-05 DIAGNOSIS — I10 Essential (primary) hypertension: Secondary | ICD-10-CM | POA: Diagnosis present

## 2014-08-05 DIAGNOSIS — Z7902 Long term (current) use of antithrombotics/antiplatelets: Secondary | ICD-10-CM | POA: Diagnosis not present

## 2014-08-05 DIAGNOSIS — Y95 Nosocomial condition: Secondary | ICD-10-CM | POA: Diagnosis present

## 2014-08-05 DIAGNOSIS — R7989 Other specified abnormal findings of blood chemistry: Secondary | ICD-10-CM

## 2014-08-05 DIAGNOSIS — J449 Chronic obstructive pulmonary disease, unspecified: Secondary | ICD-10-CM | POA: Diagnosis not present

## 2014-08-05 DIAGNOSIS — B961 Klebsiella pneumoniae [K. pneumoniae] as the cause of diseases classified elsewhere: Secondary | ICD-10-CM | POA: Diagnosis present

## 2014-08-05 DIAGNOSIS — W1830XA Fall on same level, unspecified, initial encounter: Secondary | ICD-10-CM | POA: Diagnosis present

## 2014-08-05 DIAGNOSIS — J189 Pneumonia, unspecified organism: Secondary | ICD-10-CM | POA: Diagnosis present

## 2014-08-05 DIAGNOSIS — Z9981 Dependence on supplemental oxygen: Secondary | ICD-10-CM | POA: Diagnosis not present

## 2014-08-05 DIAGNOSIS — Z79899 Other long term (current) drug therapy: Secondary | ICD-10-CM

## 2014-08-05 DIAGNOSIS — Z881 Allergy status to other antibiotic agents status: Secondary | ICD-10-CM

## 2014-08-05 DIAGNOSIS — A419 Sepsis, unspecified organism: Secondary | ICD-10-CM | POA: Diagnosis present

## 2014-08-05 DIAGNOSIS — J441 Chronic obstructive pulmonary disease with (acute) exacerbation: Secondary | ICD-10-CM | POA: Diagnosis present

## 2014-08-05 DIAGNOSIS — K219 Gastro-esophageal reflux disease without esophagitis: Secondary | ICD-10-CM | POA: Diagnosis present

## 2014-08-05 DIAGNOSIS — J8410 Pulmonary fibrosis, unspecified: Secondary | ICD-10-CM | POA: Diagnosis present

## 2014-08-05 DIAGNOSIS — E785 Hyperlipidemia, unspecified: Secondary | ICD-10-CM | POA: Diagnosis present

## 2014-08-05 DIAGNOSIS — Z888 Allergy status to other drugs, medicaments and biological substances status: Secondary | ICD-10-CM | POA: Diagnosis not present

## 2014-08-05 DIAGNOSIS — Z8673 Personal history of transient ischemic attack (TIA), and cerebral infarction without residual deficits: Secondary | ICD-10-CM | POA: Diagnosis not present

## 2014-08-05 DIAGNOSIS — H9190 Unspecified hearing loss, unspecified ear: Secondary | ICD-10-CM | POA: Diagnosis present

## 2014-08-05 DIAGNOSIS — I9589 Other hypotension: Secondary | ICD-10-CM

## 2014-08-05 HISTORY — DX: Unspecified osteoarthritis, unspecified site: M19.90

## 2014-08-05 HISTORY — DX: Pulmonary fibrosis, unspecified: J84.10

## 2014-08-05 HISTORY — DX: Unspecified hearing loss, unspecified ear: H91.90

## 2014-08-05 HISTORY — DX: Gastro-esophageal reflux disease without esophagitis: K21.9

## 2014-08-05 LAB — BASIC METABOLIC PANEL
ANION GAP: 12 (ref 5–15)
BUN: 17 mg/dL (ref 6–20)
CO2: 33 mmol/L — AB (ref 22–32)
CREATININE: 1.05 mg/dL — AB (ref 0.44–1.00)
Calcium: 8.3 mg/dL — ABNORMAL LOW (ref 8.9–10.3)
Chloride: 93 mmol/L — ABNORMAL LOW (ref 101–111)
GFR calc Af Amer: 55 mL/min — ABNORMAL LOW (ref >60)
GFR calc non Af Amer: 47 mL/min — ABNORMAL LOW (ref >60)
Glucose, Bld: 93 mg/dL (ref 65–99)
Potassium: 4 mmol/L (ref 3.5–5.1)
Sodium: 138 mmol/L (ref 135–145)

## 2014-08-05 LAB — CBC
HCT: 35.9 % (ref 35.0–47.0)
Hemoglobin: 11.7 g/dL — ABNORMAL LOW (ref 12.0–16.0)
MCH: 29.7 pg (ref 26.0–34.0)
MCHC: 32.5 g/dL (ref 32.0–36.0)
MCV: 91.2 fL (ref 80.0–100.0)
Platelets: 145 10*3/uL — ABNORMAL LOW (ref 150–440)
RBC: 3.94 MIL/uL (ref 3.80–5.20)
RDW: 17.1 % — AB (ref 11.5–14.5)
WBC: 10.2 10*3/uL (ref 3.6–11.0)

## 2014-08-05 LAB — C DIFFICILE QUICK SCREEN W PCR REFLEX
C DIFFICLE (CDIFF) ANTIGEN: NEGATIVE
C Diff interpretation: NEGATIVE
C Diff toxin: NEGATIVE

## 2014-08-05 LAB — URINALYSIS COMPLETE WITH MICROSCOPIC (ARMC ONLY)
BACTERIA UA: NONE SEEN
Bilirubin Urine: NEGATIVE
Glucose, UA: NEGATIVE mg/dL
KETONES UR: NEGATIVE mg/dL
Nitrite: NEGATIVE
PH: 8 (ref 5.0–8.0)
Protein, ur: NEGATIVE mg/dL
Specific Gravity, Urine: 1.011 (ref 1.005–1.030)

## 2014-08-05 LAB — TROPONIN I
TROPONIN I: 0.26 ng/mL — AB (ref <0.031)
Troponin I: 0.2 ng/mL — ABNORMAL HIGH (ref <0.031)
Troponin I: 0.14 ng/mL — ABNORMAL HIGH (ref <0.031)

## 2014-08-05 MED ORDER — CELECOXIB 200 MG PO CAPS
200.0000 mg | ORAL_CAPSULE | Freq: Every day | ORAL | Status: DC | PRN
  Filled 2014-08-05: qty 1

## 2014-08-05 MED ORDER — ESCITALOPRAM OXALATE 10 MG PO TABS
10.0000 mg | ORAL_TABLET | Freq: Every day | ORAL | Status: DC
  Administered 2014-08-05 – 2014-08-07 (×3): 10 mg via ORAL
  Filled 2014-08-05 (×3): qty 1

## 2014-08-05 MED ORDER — METHYLPREDNISOLONE SODIUM SUCC 125 MG IJ SOLR
60.0000 mg | Freq: Two times a day (BID) | INTRAMUSCULAR | Status: DC
  Administered 2014-08-05 – 2014-08-07 (×4): 60 mg via INTRAVENOUS
  Filled 2014-08-05 (×3): qty 2

## 2014-08-05 MED ORDER — MONTELUKAST SODIUM 10 MG PO TABS
10.0000 mg | ORAL_TABLET | Freq: Every day | ORAL | Status: DC
  Administered 2014-08-06 (×2): 10 mg via ORAL
  Filled 2014-08-05 (×2): qty 1

## 2014-08-05 MED ORDER — ALBUTEROL SULFATE (2.5 MG/3ML) 0.083% IN NEBU: 3.0000 mL | INHALATION_SOLUTION | Freq: Four times a day (QID) | RESPIRATORY_TRACT | Status: DC | PRN

## 2014-08-05 MED ORDER — SODIUM CHLORIDE 0.9 % IV SOLN
250.0000 mL | INTRAVENOUS | Status: DC | PRN
  Administered 2014-08-06: 250 mL via INTRAVENOUS

## 2014-08-05 MED ORDER — ACETAMINOPHEN 650 MG RE SUPP: 650.0000 mg | Freq: Four times a day (QID) | RECTAL | Status: DC | PRN

## 2014-08-05 MED ORDER — ONDANSETRON HCL 4 MG PO TABS: 4.0000 mg | ORAL_TABLET | Freq: Four times a day (QID) | ORAL | Status: DC | PRN

## 2014-08-05 MED ORDER — ASPIRIN EC 81 MG PO TBEC: 81.0000 mg | DELAYED_RELEASE_TABLET | Freq: Every day | ORAL | Status: DC

## 2014-08-05 MED ORDER — LEFLUNOMIDE 20 MG PO TABS
10.0000 mg | ORAL_TABLET | Freq: Every morning | ORAL | Status: DC
  Administered 2014-08-07: 10:00:00 via ORAL
  Filled 2014-08-05 (×3): qty 1

## 2014-08-05 MED ORDER — ATORVASTATIN CALCIUM 20 MG PO TABS
80.0000 mg | ORAL_TABLET | Freq: Every day | ORAL | Status: DC
  Administered 2014-08-05 – 2014-08-06 (×2): 80 mg via ORAL
  Filled 2014-08-05 (×2): qty 4

## 2014-08-05 MED ORDER — CARVEDILOL 6.25 MG PO TABS
6.2500 mg | ORAL_TABLET | Freq: Two times a day (BID) | ORAL | Status: DC
  Administered 2014-08-06 – 2014-08-07 (×2): 6.25 mg via ORAL
  Filled 2014-08-05 (×2): qty 1

## 2014-08-05 MED ORDER — CLONAZEPAM 0.5 MG PO TABS
0.5000 mg | ORAL_TABLET | Freq: Three times a day (TID) | ORAL | Status: DC | PRN
  Administered 2014-08-06 – 2014-08-07 (×4): 0.5 mg via ORAL
  Filled 2014-08-05 (×4): qty 1

## 2014-08-05 MED ORDER — TRAMADOL HCL 50 MG PO TABS: 50.0000 mg | ORAL_TABLET | Freq: Two times a day (BID) | ORAL | Status: DC | PRN

## 2014-08-05 MED ORDER — AZITHROMYCIN 250 MG PO TABS
250.0000 mg | ORAL_TABLET | Freq: Every day | ORAL | Status: DC
  Administered 2014-08-06 – 2014-08-07 (×2): 250 mg via ORAL
  Filled 2014-08-05 (×2): qty 1

## 2014-08-05 MED ORDER — ONDANSETRON HCL 4 MG/2ML IJ SOLN: 4.0000 mg | Freq: Four times a day (QID) | INTRAMUSCULAR | Status: DC | PRN

## 2014-08-05 MED ORDER — ENOXAPARIN SODIUM 40 MG/0.4ML ~~LOC~~ SOLN
40.0000 mg | SUBCUTANEOUS | Status: DC
Start: 2014-08-05 — End: 2014-08-07
  Administered 2014-08-05 – 2014-08-06 (×2): 40 mg via SUBCUTANEOUS
  Filled 2014-08-05 (×2): qty 0.4

## 2014-08-05 MED ORDER — SODIUM CHLORIDE 0.9 % IJ SOLN: 3.0000 mL | Freq: Two times a day (BID) | INTRAMUSCULAR | Status: DC

## 2014-08-05 MED ORDER — CLOPIDOGREL BISULFATE 75 MG PO TABS
75.0000 mg | ORAL_TABLET | Freq: Every day | ORAL | Status: DC
  Administered 2014-08-05 – 2014-08-07 (×3): 75 mg via ORAL
  Filled 2014-08-05 (×3): qty 1

## 2014-08-05 MED ORDER — METHYLPREDNISOLONE SODIUM SUCC 125 MG IJ SOLR
INTRAMUSCULAR | Status: AC
  Filled 2014-08-05: qty 2

## 2014-08-05 MED ORDER — AZITHROMYCIN 250 MG PO TABS
ORAL_TABLET | ORAL | Status: AC
Start: 2014-08-05 — End: 2014-08-05
  Filled 2014-08-05: qty 2

## 2014-08-05 MED ORDER — ALUM & MAG HYDROXIDE-SIMETH 200-200-20 MG/5ML PO SUSP: 30.0000 mL | Freq: Four times a day (QID) | ORAL | Status: DC | PRN

## 2014-08-05 MED ORDER — GABAPENTIN 600 MG PO TABS
600.0000 mg | ORAL_TABLET | Freq: Three times a day (TID) | ORAL | Status: DC
  Filled 2014-08-05 (×4): qty 1

## 2014-08-05 MED ORDER — PREGABALIN 25 MG PO CAPS
25.0000 mg | ORAL_CAPSULE | Freq: Three times a day (TID) | ORAL | Status: DC
  Administered 2014-08-06 – 2014-08-07 (×4): 25 mg via ORAL
  Filled 2014-08-05 (×5): qty 1

## 2014-08-05 MED ORDER — GABAPENTIN 300 MG PO CAPS
600.0000 mg | ORAL_CAPSULE | Freq: Three times a day (TID) | ORAL | Status: DC
  Administered 2014-08-05 – 2014-08-07 (×5): 600 mg via ORAL
  Filled 2014-08-05 (×5): qty 2

## 2014-08-05 MED ORDER — VANCOMYCIN HCL IN DEXTROSE 750-5 MG/150ML-% IV SOLN
750.0000 mg | INTRAVENOUS | Status: AC
  Administered 2014-08-05: 750 mg via INTRAVENOUS
  Filled 2014-08-05: qty 150

## 2014-08-05 MED ORDER — SODIUM CHLORIDE 0.9 % IJ SOLN
3.0000 mL | Freq: Two times a day (BID) | INTRAMUSCULAR | Status: DC
  Administered 2014-08-06: 3 mL via INTRAVENOUS

## 2014-08-05 MED ORDER — VANCOMYCIN HCL IN DEXTROSE 750-5 MG/150ML-% IV SOLN
750.0000 mg | INTRAVENOUS | Status: DC
  Administered 2014-08-06 – 2014-08-07 (×2): 750 mg via INTRAVENOUS
  Filled 2014-08-05 (×4): qty 150

## 2014-08-05 MED ORDER — CALCIUM CARBONATE-VITAMIN D 500-200 MG-UNIT PO TABS
1.0000 | ORAL_TABLET | Freq: Two times a day (BID) | ORAL | Status: DC
  Administered 2014-08-05 – 2014-08-07 (×4): 1 via ORAL
  Filled 2014-08-05 (×6): qty 1

## 2014-08-05 MED ORDER — PANTOPRAZOLE SODIUM 40 MG PO TBEC
40.0000 mg | DELAYED_RELEASE_TABLET | Freq: Every morning | ORAL | Status: DC
  Administered 2014-08-06 – 2014-08-07 (×2): 40 mg via ORAL
  Filled 2014-08-05 (×2): qty 1

## 2014-08-05 MED ORDER — SODIUM CHLORIDE 0.9 % IJ SOLN: 3.0000 mL | INTRAMUSCULAR | Status: DC | PRN

## 2014-08-05 MED ORDER — PIPERACILLIN-TAZOBACTAM 3.375 G IVPB
INTRAVENOUS | Status: AC
  Filled 2014-08-05: qty 50

## 2014-08-05 MED ORDER — LOPERAMIDE HCL 2 MG PO CAPS: 2.0000 mg | ORAL_CAPSULE | Freq: Four times a day (QID) | ORAL | Status: DC | PRN

## 2014-08-05 MED ORDER — SODIUM CHLORIDE 0.9 % IV BOLUS (SEPSIS)
500.0000 mL | Freq: Once | INTRAVENOUS | Status: AC
  Administered 2014-08-05: 500 mL via INTRAVENOUS

## 2014-08-05 MED ORDER — ACETAMINOPHEN 325 MG PO TABS
650.0000 mg | ORAL_TABLET | Freq: Four times a day (QID) | ORAL | Status: DC | PRN
Start: 2014-08-05 — End: 2014-08-07
  Administered 2014-08-07: 650 mg via ORAL
  Filled 2014-08-05: qty 2

## 2014-08-05 MED ORDER — PIPERACILLIN-TAZOBACTAM 3.375 G IVPB
3.3750 g | Freq: Once | INTRAVENOUS | Status: AC
  Administered 2014-08-05: 3.375 g via INTRAVENOUS

## 2014-08-05 MED ORDER — IPRATROPIUM-ALBUTEROL 0.5-2.5 (3) MG/3ML IN SOLN
RESPIRATORY_TRACT | Status: AC
  Administered 2014-08-05: 10:00:00
  Filled 2014-08-05: qty 3

## 2014-08-05 MED ORDER — PIPERACILLIN-TAZOBACTAM 3.375 G IVPB
3.3750 g | Freq: Three times a day (TID) | INTRAVENOUS | Status: DC
  Administered 2014-08-06 – 2014-08-07 (×5): 3.375 g via INTRAVENOUS
  Filled 2014-08-05 (×10): qty 50

## 2014-08-05 MED ORDER — IPRATROPIUM-ALBUTEROL 0.5-2.5 (3) MG/3ML IN SOLN
3.0000 mL | RESPIRATORY_TRACT | Status: DC
  Administered 2014-08-05 – 2014-08-07 (×10): 3 mL via RESPIRATORY_TRACT
  Filled 2014-08-05 (×10): qty 3

## 2014-08-05 MED ORDER — VANCOMYCIN HCL IN DEXTROSE 1-5 GM/200ML-% IV SOLN: 1000.0000 mg | Freq: Once | INTRAVENOUS | Status: DC

## 2014-08-05 MED ORDER — AZITHROMYCIN 250 MG PO TABS
500.0000 mg | ORAL_TABLET | Freq: Every day | ORAL | Status: AC
  Administered 2014-08-05: 500 mg via ORAL

## 2014-08-05 MED ORDER — POTASSIUM CHLORIDE ER 10 MEQ PO TBCR
10.0000 meq | EXTENDED_RELEASE_TABLET | Freq: Every day | ORAL | Status: DC
  Administered 2014-08-05 – 2014-08-07 (×3): 10 meq via ORAL
  Filled 2014-08-05 (×6): qty 1

## 2014-08-05 MED ORDER — MOMETASONE FURO-FORMOTEROL FUM 200-5 MCG/ACT IN AERO
2.0000 | INHALATION_SPRAY | Freq: Two times a day (BID) | RESPIRATORY_TRACT | Status: DC
Start: 2014-08-05 — End: 2014-08-07
  Administered 2014-08-05 – 2014-08-07 (×4): 2 via RESPIRATORY_TRACT
  Filled 2014-08-05: qty 8.8

## 2014-08-05 NOTE — ED Notes (Signed)
Pt here from [facility] assisted [facility] via ems with c/o sob and cough for a few days, pt states she fell last night in the bathroom as well, states her right arm is hurting from fall. Pt denies loc, states she was supposed to have help walking to bathroom, however, couldn't wait for help. Pt appears sob, using stomach muscles to breath, sats in the 80's on 2L. Pt states she is on 4L O2 around the clock at facility. Tylenol given per ems for fever of 101.9. Pt diaphoretic upon arrival, temp now 98.5 orally. Wheezes noted all lobes, crackles bilateral bases.

## 2014-08-05 NOTE — Clinical Social Work Note (Signed)
CSW updated Denise Duke at [facility] that patient was going to be admitted into the hospital. CSW contacted Dr. Elpidio Anis and he has agreed to order a Palliative Care Consult for goals of care. CSW will follow up with unit to which patient is admitted to in the morning.  York Spaniel MSW,LCSWA [phone]

## 2014-08-05 NOTE — Progress Notes (Signed)
ANTIBIOTIC CONSULT NOTE - INITIAL  Pharmacy Consult for Vancomycin/Zosyn Indication: rule out sepsis/UTI  Allergies  Allergen Reactions  . Aspirin Shortness Of Breath  . Levaquin [Levofloxacin In D5w] Other (See Comments)    GI Distress, Cramps  . Sulfa Antibiotics Other (See Comments)    unknown    Patient Measurements: Height: 4\' 9"  (144.8 cm) Weight: 173 lb (78.472 kg) IBW/kg (Calculated) : 38.6 Adjusted Body Weight: 58.7 kg  Vital Signs: Temp: 98.7 F (37.1 C) (05/23 1203) Temp Source: Oral (05/23 1203) BP: 127/47 mmHg (05/23 1600) Pulse Rate: 108 (05/23 1600) Intake/Output from previous day:   Intake/Output from this shift: Total I/O In: -  Out: 100 [Urine:100]  Labs:  Recent Labs  08/05/14 0957  WBC 10.2  HGB 11.7*  PLT 145*  CREATININE 1.05*   Estimated Creatinine Clearance: 33.8 mL/min (by C-G formula based on Cr of 1.05). No results for input(s): VANCOTROUGH, VANCOPEAK, VANCORANDOM, GENTTROUGH, GENTPEAK, GENTRANDOM, TOBRATROUGH, TOBRAPEAK, TOBRARND, AMIKACINPEAK, AMIKACINTROU, AMIKACIN in the last 72 hours.   Microbiology: Recent Results (from the past 720 hour(s))  Culture, blood (single)     Status: None (Preliminary result)   Collection Time: 07/11/14 10:34 AM  Result Value Ref Range Status   Micro Text Report   Preliminary       COMMENT                   NO GROWTH IN 48 HOURS   ANTIBIOTIC                                                      Culture, blood (single)     Status: None (Preliminary result)   Collection Time: 07/11/14 10:48 AM  Result Value Ref Range Status   Micro Text Report   Preliminary       COMMENT                   NO GROWTH IN 48 HOURS   ANTIBIOTIC                                                      Blood Culture (routine x 2)     Status: None   Collection Time: 07/23/14 11:25 AM  Result Value Ref Range Status   Specimen Description BLOOD  Final   Special Requests BLOOD  Final   Culture NO GROWTH 5 DAYS  Final    Report Status 07/28/2014 FINAL  Final  Blood Culture (routine x 2)     Status: None   Collection Time: 07/23/14 11:45 AM  Result Value Ref Range Status   Specimen Description BLOOD  Final   Special Requests BLOOD  Final   Culture NO GROWTH 5 DAYS  Final   Report Status 07/28/2014 FINAL  Final  Urine culture     Status: None   Collection Time: 07/23/14 12:15 PM  Result Value Ref Range Status   Specimen Description URINE, RANDOM  Final   Special Requests URINE, RANDOM  Final   Culture   Final    >=100,000 COLONIES/mL PROTEUS MIRABILIS 30,000 COLONIES/ml KLEBSIELLA PNEUMONIAE    Report Status 07/27/2014 FINAL  Final   Organism  ID, Bacteria KLEBSIELLA PNEUMONIAE  Final   Organism ID, Bacteria PROTEUS MIRABILIS  Final      Susceptibility   Klebsiella pneumoniae - MIC*    AMPICILLIN 8 RESISTANT Resistant     CEFTAZIDIME <=1 SENSITIVE Sensitive     CEFAZOLIN <=4 SENSITIVE Sensitive     CEFTRIAXONE <=1 SENSITIVE Sensitive     CIPROFLOXACIN <=0.25 SENSITIVE Sensitive     GENTAMICIN <=1 SENSITIVE Sensitive     IMIPENEM <=0.25 SENSITIVE Sensitive     TRIMETH/SULFA <=20 SENSITIVE Sensitive     CEFOXITIN <=4 SENSITIVE Sensitive     * 30,000 COLONIES/ml KLEBSIELLA PNEUMONIAE   Proteus mirabilis - MIC*    AMPICILLIN <=2 SENSITIVE Sensitive     CEFTAZIDIME <=1 SENSITIVE Sensitive     CEFAZOLIN <=4 SENSITIVE Sensitive     CEFTRIAXONE <=1 SENSITIVE Sensitive     CIPROFLOXACIN 2 INTERMEDIATE Intermediate     GENTAMICIN <=1 SENSITIVE Sensitive     IMIPENEM 2 SENSITIVE Sensitive     TRIMETH/SULFA <=20 SENSITIVE Sensitive     CEFOXITIN <=4 SENSITIVE Sensitive     * >=100,000 COLONIES/mL PROTEUS MIRABILIS  Influenza A&B Antigens [facility])     Status: None   Collection Time: 07/23/14 12:25 PM  Result Value Ref Range Status   Influenza A [facility]) NOT DETECTED  Final   Influenza B ([facility]) NOT DETECTED  Final  C difficile quick scan w PCR reflex [facility])     Status: None   Collection Time: 08/05/14  10:02 AM  Result Value Ref Range Status   C Diff antigen NEGATIVE  Final   C Diff toxin Negative for C. difficile  Final   C Diff interpretation NEGATIVE  Final    Medical History: Past Medical History  Diagnosis Date  . CHF (congestive heart failure)   . TIA (transient ischemic attack)   . Anxiety   . COPD (chronic obstructive pulmonary disease)   . Hypertension   . Pulmonary fibrosis   . Hearing loss   . Arthritis   . GERD (gastroesophageal reflux disease)     Medications:  Scheduled:  . [START ON 08/06/2014] azithromycin  250 mg Oral Daily  . methylPREDNISolone (SOLU-MEDROL) injection  60 mg Intravenous Q12H  . vancomycin  750 mg Intravenous STAT  . vancomycin  750 mg Intravenous Q24H   Infusions:  . piperacillin-tazobactam (ZOSYN)  IV 3.375 g (08/05/14 1537)  . piperacillin-tazobactam (ZOSYN)  IV     Assessment: Empiric abx for sepsis and UTI   Goal of Therapy:  Vancomycin trough level 15-20 mcg/ml  Plan:  1. Vancomycin 750 mg iv q 24 h with stacked dosing and a trough with the 4th dose.   2. Zosyn 3.375 g EI q 8 h.   Luisa Hart D 08/05/2014,4:11 PM

## 2014-08-05 NOTE — ED Notes (Signed)
C-Diff negative.  Enteric precautions removed. 

## 2014-08-05 NOTE — ED Notes (Signed)
Patient had another episode of diarrhea. Linens changed and patient repositioned. No other needs at this time.

## 2014-08-05 NOTE — ED Provider Notes (Addendum)
[facility] Emergency Department Provider Note   ____________________________________________  Time seen: 10 AM I have reviewed the triage vital signs and the triage nursing note.  HISTORY  Chief Complaint Shortness of Breath and Fall   Historian Limited as patient is very weak, but patient provides most history sister provided some history  HPI Denise Duke is a 81 y.o. female who lives in an assisted living facility who was recently treated for urinary tract infection, about 2 weeks ago and in the hospital. Patient says she has been overall very weak since then. She was too weak to open the door last night and sort of fell onto her right side but was not injured. The nurses put her back in bed. This morning when she was checked on she had a low blood pressure, low oxygen saturation and elevated heart rate. Patient is not having any specific shortness of breath, cough, fever, chest pain, focal weakness, however she is complaining of overall severe generalized weakness.    Past Medical History  Diagnosis Date  . CHF (congestive heart failure)   . TIA (transient ischemic attack)   . Anxiety   . COPD (chronic obstructive pulmonary disease)   . Hypertension     Patient Active Problem List   Diagnosis Date Noted  . Acute respiratory distress 07/28/2014  . Pneumonia 07/28/2014  . Atrial fibrillation 07/28/2014  . COPD exacerbation 07/28/2014  . UTI (lower urinary tract infection) 07/23/2014  . Sepsis 07/23/2014  . ARF (acute renal failure) 07/23/2014  . Septic shock 07/23/2014    Past Surgical History  Procedure Laterality Date  . Cholecystectomy    . Hernia repair    . Abdominal hysterectomy      Current Outpatient Rx  Name  Route  Sig  Dispense  Refill  . acetaminophen (TYLENOL) 500 MG tablet   Oral   Take 500 mg by mouth 2 (two) times daily as needed (pain).         Marland Kitchen albuterol (PROVENTIL) (2.5 MG/3ML) 0.083% nebulizer solution  Nebulization   Take 3 mLs by nebulization every 6 (six) hours as needed for wheezing.         Marland Kitchen albuterol-ipratropium (COMBIVENT) 18-103 MCG/ACT inhaler   Inhalation   Inhale into the lungs every 4 (four) hours.         Marland Kitchen alendronate (FOSAMAX) 70 MG tablet   Oral   Take 70 mg by mouth once a week. On Tuesdays.Take with a full glass of water on an empty stomach. Do not lie down for 30 minutes after dose.         Marland Kitchen atorvastatin (LIPITOR) 80 MG tablet   Oral   Take 80 mg by mouth at bedtime.         . Calcium Carbonate-Vitamin D 600-400 MG-UNIT per tablet   Oral   Take 1 tablet by mouth 2 (two) times daily.         . capsaicin (ZOSTRIX) 0.025 % cream   Topical   Apply topically 2 (two) times daily. Apply topically to affected area 2 times a day, as needed for pain         . carvedilol (COREG) 6.25 MG tablet   Oral   Take 1 tablet (6.25 mg total) by mouth 2 (two) times daily with a meal.   60 tablet   1   . celecoxib (CELEBREX) 200 MG capsule   Oral   Take 200 mg by mouth daily as needed (as needed  for pain).         . cephALEXin (KEFLEX) 500 MG capsule   Oral   Take 1 capsule (500 mg total) by mouth 3 (three) times daily.   30 capsule   0   . clonazePAM (KLONOPIN) 0.5 MG tablet   Oral   Take 1 tablet (0.5 mg total) by mouth 3 (three) times daily as needed (nervousness).   30 tablet   0   . clopidogrel (PLAVIX) 75 MG tablet   Oral   Take 75 mg by mouth daily.         Marland Kitchen CRANBERRY EXTRACT PO   Oral   Take 425 mg by mouth daily. Cranberry tablet         . escitalopram (LEXAPRO) 10 MG tablet   Oral   Take 10 mg by mouth daily.         . Fluticasone-Salmeterol (ADVAIR) 500-50 MCG/DOSE AEPB   Inhalation   Inhale 1 puff into the lungs 2 (two) times daily.         . furosemide (LASIX) 20 MG tablet   Oral   Take 800 mg by mouth daily.          Marland Kitchen gabapentin (NEURONTIN) 600 MG tablet   Oral   Take 600 mg by mouth 3 (three) times daily.          . isosorbide mononitrate (IMDUR) 30 MG 24 hr tablet   Oral   Take 30 mg by mouth daily.         Marland Kitchen leflunomide (ARAVA) 20 MG tablet   Oral   Take 10 mg by mouth every morning. Take half tab of 20mg          . lisinopril (PRINIVIL,ZESTRIL) 2.5 MG tablet   Oral   Take 1.25 mg by mouth daily. Take half tab of 2.5mg          . loperamide (IMODIUM) 2 MG capsule   Oral   Take by mouth 4 (four) times daily as needed for diarrhea or loose stools.         . methylPREDNISolone (MEDROL DOSEPAK) 4 MG TBPK tablet      follow package directions   21 tablet   0   . montelukast (SINGULAIR) 10 MG tablet   Oral   Take 10 mg by mouth at bedtime.         . pantoprazole (PROTONIX) 40 MG tablet   Oral   Take 40 mg by mouth every morning.         . potassium chloride (K-DUR) 10 MEQ tablet   Oral   Take 10 mEq by mouth daily.         . pregabalin (LYRICA) 25 MG capsule   Oral   Take 25 mg by mouth 3 (three) times daily.         . Probiotic Product (PROBIOTIC FORMULA PO)   Oral   Take by mouth every morning. Probiotic Formula-oral cap         . traMADol (ULTRAM) 50 MG tablet   Oral   Take 1 tablet (50 mg total) by mouth 2 (two) times daily as needed for moderate pain (pain).   30 tablet   0     Allergies Aspirin; Cephalosporins; Levaquin; and Sulfa antibiotics  No family history on file.  Social History History  Substance Use Topics  . Smoking status: Never Smoker   . Smokeless tobacco: Current User    Types: Snuff  . Alcohol Use: No  Review of Systems  Constitutional: Negative for fever. Eyes: Negative for visual changes. ENT: Negative for sore throat. Cardiovascular: Negative for chest pain. Respiratory: Negative for shortness of breath. Does wear home 3 L oxygen all the time Gastrointestinal: Negative for abdominal pain, vomiting and diarrhea. Genitourinary: Negative for dysuria. Musculoskeletal: Negative for back pain. Skin: Negative for  rash. Neurological: Negative for headaches, focal weakness or numbness.  ____________________________________________   PHYSICAL EXAM:  VITAL SIGNS: ED Triage Vitals  Enc Vitals Group     BP 08/05/14 0930 116/96 mmHg     Pulse Rate 08/05/14 0915 110     Resp 08/05/14 0900 28     Temp 08/05/14 0935 98.5 F (36.9 C)     Temp Source 08/05/14 0935 Oral     SpO2 08/05/14 0915 95 %     Weight 08/05/14 0935 173 lb (78.472 kg)     Height 08/05/14 0935 4\' 9"  (1.448 m)     Head Cir --      Peak Flow --      Pain Score --      Pain Loc --      Pain Edu? --      Excl. in GC? --      Constitutional: Alert and oriented. Cooperative and pleasant, but appears tired and weak all over Eyes: Conjunctivae are normal. PERRL. Normal extraocular movements. ENT   Head: Normocephalic and atraumatic.   Nose: No congestion/rhinnorhea.   Mouth/Throat: Mucous membranes are mildly dry.   Neck: No stridor. Cardiovascular: Regular and tachycardic.  No murmurs, rubs, or gallops. Respiratory: No tachypnea nor retractions however she does have mild rhonchi posteriorly both sides and a mild wheeze. Gastrointestinal: Soft and nontender. No distention. Obese Genitourinary: Musculoskeletal: Nontender with normal range of motion in all extremities. No focal point tenderness of the joints or the soft tissues of the arms and legs. Trace to 1+ lower extremity edema bilaterally with no calf tenderness Neurologic:  Normal speech and language. No gross focal neurologic deficits are appreciated. Generalized decreased weakness all over. Skin:  Skin is warm, dry and intact. No rash noted. Psychiatric: Mood and affect are normal. Speech and behavior are normal. Patient exhibits appropriate insight and judgment.  ____________________________________________   EKG  I, , MD, the attending physician have personally viewed and interpreted this ECG.  Sinus tachycardia. 1 25 bpm. Narrow QRS. Left  axis deviation. Q waves inferiorly. No ST segment elevation, depression, pathologic T-wave inversion ____________________________________________  LABS (pertinent positives/negatives)  Urinalysis negative CBC showing a normal white blood count 10.2 and hemoglobin 11.7 C. difficile negative BUN 17 crying 1.05 Troponin 0.26  ____________________________________________  RADIOLOGY Radiologist results reviewed  Chest x-ray: Stable interstitial densities throughout both lungs consistent with pulmonary fibrosis and unchanged from prior with minimal bilateral pleural effusions. __________________________________________  PROCEDURES  Procedure(s) performed: No Critical Care performed: No  ____________________________________________   ED COURSE / ASSESSMENT AND PLAN  Pertinent labs & imaging results that were available during my care of the patient were reviewed by me and considered in my medical decision making (see chart for details).  From a fall and trauma perspective, clinically I do not suspect any traumatic injury. There is no report of head injury and she had no altered mental status, no focal neurologic changes, no neck pain no chest pain. She has mild right-sided arm pain but there is no point tenderness and she is able to have full range of motion and do not suspect a fracture.  With  respect to her low blood pressure and tachycardia, she is without fever and I'm more suspicious of some dehydration rather than a less likely consideration for sepsis.  Chest x-ray did not show a new acute pulmonary infiltrate.  After 500 cc of normal saline her blood pressure is up to a systolic of 109 and she is having some improvement of her heart rate from 99 to 110 if she moves around. She is so weak she cannot get up to the side of the bed. She is unable to back to assisted living. She will need more of a acute care rehabilitation stay. Given her troponin is 0.26 I will consult the  hospitalist for admission, she does have a baseline elevated troponin at 0.1 in the past. I suspect this may be some additional strain due to the tachycardia and mild hypertension on arrival.  Laboratory evaluation did not support any new diagnosis of sepsis. After 500 cc of fluid she has having some improvement with her blood pressure and I suspect she had some dehydration. I discussed the case with the hospitalist who agreed that inpatient hospitalization is unlikely to provide any additional benefit rather than sending her to an acute care rehabilitation center. Her troponin will be rechecked to make sure it is not going up. If the troponin is significantly elevated then she may need to be admitted to the hospital. Social work was consulted for acute care rehabilitation disposition.   ----------------------------------------- 2:05 PM on 08/05/2014 -----------------------------------------  Further review of the nursing notes revealed that the patient had had a fever upon EMS arrival and received Tylenol. I was not aware of this until this point in time. However because of this and makes sepsis more likely and I will cover her with a grandson and Zosyn have her admitted to the hospital. ___________________________________________   FINAL CLINICAL IMPRESSION(S) / ED DIAGNOSES   Final diagnoses:  Elevated troponin I level  Other specified hypotension   sepsis, unspecified source.  Governor Rooks, MD 08/05/14 1400  Governor Rooks, MD 08/05/14 [id number]

## 2014-08-05 NOTE — Clinical Social Work Note (Signed)
CSW received a call from Dr. Shaune Pollack requesting CSW placing patient in higher level of care from her current level of care which is at [facility]. As CSW was reviewing chart, it was discovered that the MD was now going to be admitting patient to the hospital for sepsis and would not need placement from the ED. CSW confirmed this by calling patient's nurse, Victorino Dike, who reiterated this information. Prior to this, CSW had made contact with Liborio Nixon at [facility]: [phone] to inform her of Dr. Merlene Pulling request and she inquired if patient could return with hospice services. CSW will ask for Palliative Care consult from admitting physician. FL2 updated.  York Spaniel MSW,LCSWA [phone]

## 2014-08-05 NOTE — H&P (Signed)
[facility] [place] [facility]   PATIENT NAME: Denise Duke    MR#:  [id number]  DATE OF BIRTH:  1927/06/12  DATE OF ADMISSION:  08/05/2014  PRIMARY CARE PHYSICIAN: Rolm Gala, MD   REQUESTING/REFERRING PHYSICIAN: Governor Rooks MD  CHIEF COMPLAINT:   Chief Complaint  Patient presents with  . Shortness of Breath  . Fall    HISTORY OF PRESENT ILLNESS:  Denise Duke  is a 81 y.o. female with a known history of CHF, COPD, hypertension and TIA presents to the emergency room with shortness of breath, increasing weakness and fall. Patient was recently in the hospital for septic shock and urinary tract infection with multiple bacteria in his urine. Discharged on Keflex. Seems to have improved but then started worsening with weakness and had a fall. Did not hit her head or lose consciousness. Presently still she mentions that her shortness of breath is back to normal she has no pain.  Her blood pressure was found to be low normal with systolic of 90 heart rate in 120s. Fever 101.9 on arrival to the emergency room. Chest x-ray shows bilateral pneumonia and is being admitted for healthcare acquired pneumonia and sepsis.  Baseline she wears 3-4 L oxygen due to her pulmonary fibrosis. And was sent to assisted living facility with home health PT for worsening weakness during recent discharge.  Patient received 500 mL normal saline bolus in the emergency room.  PAST MEDICAL HISTORY:   Past Medical History  Diagnosis Date  . CHF (congestive heart failure)   . TIA (transient ischemic attack)   . Anxiety   . COPD (chronic obstructive pulmonary disease)   . Hypertension   . Pulmonary fibrosis   . Hearing loss   . Arthritis   . GERD (gastroesophageal reflux disease)     PAST SURGICAL HISTORY:   Past Surgical History  Procedure Laterality Date  . Cholecystectomy    . Hernia repair    . Abdominal hysterectomy      SOCIAL HISTORY:   History  Substance  Use Topics  . Smoking status: Never Smoker   . Smokeless tobacco: Current User    Types: Snuff  . Alcohol Use: No    FAMILY HISTORY:   Family History  Problem Relation Age of Onset  . Hypertension Mother     DRUG ALLERGIES:   Allergies  Allergen Reactions  . Aspirin Shortness Of Breath  . Levaquin [Levofloxacin In D5w] Other (See Comments)    GI Distress, Cramps  . Sulfa Antibiotics Other (See Comments)    unknown    REVIEW OF SYSTEMS:   Review of Systems  Constitutional: Positive for malaise/fatigue. Negative for fever, chills and weight loss.  HENT: Negative for hearing loss and nosebleeds.   Eyes: Negative for blurred vision, double vision and pain.  Respiratory: Positive for cough and shortness of breath. Negative for hemoptysis, sputum production and wheezing.   Cardiovascular: Negative for chest pain, palpitations, orthopnea and leg swelling.  Gastrointestinal: Negative for nausea, vomiting, abdominal pain, diarrhea and constipation.  Genitourinary: Negative for dysuria and hematuria.  Musculoskeletal: Positive for back pain. Negative for myalgias and falls.  Skin: Negative for rash.  Neurological: Negative for dizziness, tremors, sensory change, speech change, focal weakness, seizures and headaches.  Endo/Heme/Allergies: Does not bruise/bleed easily.  Psychiatric/Behavioral: Negative for depression and memory loss. The patient is not nervous/anxious.     MEDICATIONS AT HOME:   Prior to Admission medications   Medication Sig Start Date End  Date Taking? Authorizing Provider  acetaminophen (TYLENOL) 500 MG tablet Take 500 mg by mouth 2 (two) times daily as needed (pain).   Yes Historical Provider, MD  albuterol (PROVENTIL) (2.5 MG/3ML) 0.083% nebulizer solution Take 3 mLs by nebulization every 6 (six) hours as needed for wheezing.   Yes Historical Provider, MD  albuterol-ipratropium (COMBIVENT) 18-103 MCG/ACT inhaler Inhale into the lungs every 4 (four) hours.    Yes Historical Provider, MD  alendronate (FOSAMAX) 70 MG tablet Take 70 mg by mouth once a week. On Tuesdays.Take with a full glass of water on an empty stomach. Do not lie down for 30 minutes after dose.   Yes Historical Provider, MD  atorvastatin (LIPITOR) 80 MG tablet Take 80 mg by mouth at bedtime.   Yes Historical Provider, MD  Calcium Carbonate-Vitamin D 600-400 MG-UNIT per tablet Take 1 tablet by mouth 2 (two) times daily.   Yes Historical Provider, MD  capsaicin (ZOSTRIX) 0.025 % cream Apply topically 2 (two) times daily. Apply topically to affected area 2 times a day, as needed for pain   Yes Historical Provider, MD  carvedilol (COREG) 6.25 MG tablet Take 1 tablet (6.25 mg total) by mouth 2 (two) times daily with a meal. 07/28/14  Yes Katharina Caper, MD  celecoxib (CELEBREX) 200 MG capsule Take 200 mg by mouth daily as needed (as needed for pain).   Yes Historical Provider, MD  cephALEXin (KEFLEX) 500 MG capsule Take 1 capsule (500 mg total) by mouth 3 (three) times daily. 07/28/14  Yes Katharina Caper, MD  clonazePAM (KLONOPIN) 0.5 MG tablet Take 1 tablet (0.5 mg total) by mouth 3 (three) times daily as needed (nervousness). 07/28/14  Yes Katharina Caper, MD  clopidogrel (PLAVIX) 75 MG tablet Take 75 mg by mouth daily.   Yes Historical Provider, MD  CRANBERRY EXTRACT PO Take 425 mg by mouth daily. Cranberry tablet   Yes Historical Provider, MD  escitalopram (LEXAPRO) 10 MG tablet Take 10 mg by mouth daily.   Yes Historical Provider, MD  Fluticasone-Salmeterol (ADVAIR) 500-50 MCG/DOSE AEPB Inhale 1 puff into the lungs 2 (two) times daily.   Yes Historical Provider, MD  furosemide (LASIX) 20 MG tablet Take 800 mg by mouth daily.    Yes Historical Provider, MD  gabapentin (NEURONTIN) 600 MG tablet Take 600 mg by mouth 3 (three) times daily.   Yes Historical Provider, MD  isosorbide mononitrate (IMDUR) 30 MG 24 hr tablet Take 30 mg by mouth daily.   Yes Historical Provider, MD  leflunomide (ARAVA) 20  MG tablet Take 10 mg by mouth every morning. Take half tab of 20mg    Yes Historical Provider, MD  lisinopril (PRINIVIL,ZESTRIL) 2.5 MG tablet Take 1.25 mg by mouth daily. Take half tab of 2.5mg    Yes Historical Provider, MD  loperamide (IMODIUM) 2 MG capsule Take by mouth 4 (four) times daily as needed for diarrhea or loose stools.   Yes Historical Provider, MD  methylPREDNISolone (MEDROL DOSEPAK) 4 MG TBPK tablet follow package directions 07/28/14  Yes 07/30/14, MD  montelukast (SINGULAIR) 10 MG tablet Take 10 mg by mouth at bedtime.   Yes Historical Provider, MD  pantoprazole (PROTONIX) 40 MG tablet Take 40 mg by mouth every morning.   Yes Historical Provider, MD  potassium chloride (K-DUR) 10 MEQ tablet Take 10 mEq by mouth daily.   Yes Historical Provider, MD  pregabalin (LYRICA) 25 MG capsule Take 25 mg by mouth 3 (three) times daily.   Yes Historical Provider, MD  Probiotic  Product (PROBIOTIC FORMULA PO) Take by mouth every morning. Probiotic Formula-oral cap   Yes Historical Provider, MD  traMADol (ULTRAM) 50 MG tablet Take 1 tablet (50 mg total) by mouth 2 (two) times daily as needed for moderate pain (pain). 07/28/14  Yes Katharina Caper, MD      VITAL SIGNS:  Blood pressure 102/26, pulse 87, temperature 98.7 F (37.1 C), temperature source Oral, resp. rate 24, height 4\' 9"  (1.448 m), weight 78.472 kg (173 lb), SpO2 100 %.  PHYSICAL EXAMINATION:  Physical Exam  GENERAL:  81 y.o.-year-old patient lying in the bed with no acute distress. Obese. EYES: Pupils equal, round, reactive to light and accommodation. No scleral icterus. Extraocular muscles intact. Decreased hearing. HEENT: Head atraumatic, normocephalic. Oropharynx and nasopharynx clear. No oropharyngeal erythema, moist oral mucosa  NECK:  Supple, no jugular venous distention. No thyroid enlargement, no tenderness.  LUNGS: Bilateral crackles and weezing.. No use of accessory muscles of respiration.  CARDIOVASCULAR: S1, S2  normal. No murmurs, rubs, or gallops.  ABDOMEN: Soft, nontender, nondistended. Bowel sounds present. No organomegaly or mass.  EXTREMITIES: No pedal edema, cyanosis, or clubbing. + 2 pedal & radial pulses b/l.   NEUROLOGIC: Cranial nerves II through XII are intact. No focal Motor or sensory deficits appreciated b/l PSYCHIATRIC: The patient is alert and oriented x 3. Good affect.  SKIN: No obvious rash, lesion, or ulcer.   LABORATORY PANEL:   CBC  Recent Labs Lab 08/05/14 0957  WBC 10.2  HGB 11.7*  HCT 35.9  PLT 145*   ------------------------------------------------------------------------------------------------------------------  Chemistries   Recent Labs Lab 08/05/14 0957  NA 138  K 4.0  CL 93*  CO2 33*  GLUCOSE 93  BUN 17  CREATININE 1.05*  CALCIUM 8.3*   ------------------------------------------------------------------------------------------------------------------  Cardiac Enzymes  Recent Labs Lab 08/05/14 0957  TROPONINI 0.26*   ------------------------------------------------------------------------------------------------------------------  RADIOLOGY:  Dg Chest Portable 1 View  08/05/2014   CLINICAL DATA:  Shortness of breath.  Fall.  EXAM: PORTABLE CHEST - 1 VIEW  COMPARISON:  Jul 26, 2014.  FINDINGS: Stable cardiomegaly. No pneumothorax is noted. Stable interstitial densities are noted throughout both lungs consistent with pulmonary fibrosis. This is not significantly changed compared to prior exam, although superimposed inflammation or edema cannot be excluded. Minimal bilateral pleural effusions cannot be excluded. Bony thorax is intact.  IMPRESSION: Stable interstitial densities are noted throughout both lungs consistent with pulmonary fibrosis. This is unchanged compared to prior exam, although superimposed inflammation or edema cannot be excluded. Minimal bilateral pleural effusions may be present as well.   Electronically Signed   By: Jul 28, 2014, M.D.   On: 08/05/2014 10:26     IMPRESSION AND PLAN:   1. Bilateral healthcare acquired pneumonia with sepsis. Start patient on IV vancomycin, Zosyn, azithromycin. Send for blood and sputum cultures. Oxygen requirement at baseline and is needing 3-4 L oxygen. COPD exacerbation. Considering the significant wheezing she has, will also start her on IV steroids. Further management as per culture results and clinical progress.  2. Elevated troponin. Patient has had chronic elevation of troponin around 0.1. Presently this is increased to 0.26. Likely from underlying sepsis and pneumonia. No chest pain. Check more sets of troponin. Start full dose anticoagulation and consult cardiology if there is significant elevation of troponin.  3. Diastolic chf Stop lasix due to hypotension. Watch for fluid overload.  4. Hypertension Stop meds due to hypotension.  5. DVT prophylaxis Lovenox    All the records are reviewed and case  discussed with ED provider. Management plans discussed with the patient, family and they are in agreement.  CODE STATUS: FULL CODE  TOTAL TIME TAKING CARE OF THIS PATIENT: 45 minutes.    Milagros Loll R M.D on 08/05/2014 at 3:35 PM  Between 7am to 6pm - Pager - [phone]  After 6pm go to [email or web address] - password [email or web address] [email or web address]  [facility]  Office  [phone]  CC: Primary care physician; Rolm Gala, MD

## 2014-08-06 DIAGNOSIS — J9620 Acute and chronic respiratory failure, unspecified whether with hypoxia or hypercapnia: Secondary | ICD-10-CM

## 2014-08-06 DIAGNOSIS — Z9981 Dependence on supplemental oxygen: Secondary | ICD-10-CM

## 2014-08-06 DIAGNOSIS — J189 Pneumonia, unspecified organism: Secondary | ICD-10-CM

## 2014-08-06 DIAGNOSIS — J449 Chronic obstructive pulmonary disease, unspecified: Secondary | ICD-10-CM

## 2014-08-06 DIAGNOSIS — I10 Essential (primary) hypertension: Secondary | ICD-10-CM

## 2014-08-06 LAB — TROPONIN I: Troponin I: 0.07 ng/mL — ABNORMAL HIGH (ref <0.031)

## 2014-08-06 MED ORDER — SODIUM CHLORIDE 0.9 % IV SOLN: 250.0000 mL | INTRAVENOUS | Status: DC | PRN

## 2014-08-06 MED ORDER — NYSTATIN 100000 UNIT/GM EX CREA
1.0000 "application " | TOPICAL_CREAM | Freq: Two times a day (BID) | CUTANEOUS | Status: DC
  Administered 2014-08-06 – 2014-08-07 (×2): 1 via TOPICAL
  Filled 2014-08-06: qty 15

## 2014-08-06 MED ORDER — SODIUM CHLORIDE 0.9 % IV SOLN
INTRAVENOUS | Status: DC
  Administered 2014-08-06: 18:00:00 via INTRAVENOUS

## 2014-08-06 NOTE — Progress Notes (Signed)
[facility] - [place] at [facility]   PATIENT NAME: Denise Duke    MR#:  [id number]  DATE OF BIRTH:  [birth year]  SUBJECTIVE:   Resting in bed. No shortness of breath with rest, but does become short of breath with any activity. Denies pain  REVIEW OF SYSTEMS:   Review of Systems  Constitutional: Negative for fever.  Respiratory: Positive for cough, sputum production, shortness of breath and wheezing.   Cardiovascular: Negative for chest pain and palpitations.  Gastrointestinal: Negative for nausea, vomiting and abdominal pain.  Genitourinary: Negative for dysuria.    DRUG ALLERGIES:   Allergies  Allergen Reactions  . Aspirin Shortness Of Breath  . Levaquin [Levofloxacin In D5w] Other (See Comments)    GI Distress, Cramps  . Sulfa Antibiotics Other (See Comments)    unknown    VITALS:  Blood pressure 96/52, pulse 84, temperature 98.1 F (36.7 C), temperature source Oral, resp. rate 18, height 4\' 9"  (1.448 m), weight 74.163 kg (163 lb 8 oz), SpO2 94 %.  PHYSICAL EXAMINATION:  GENERAL:  81 y.o.-year-old patient lying in the bed with no acute distress, pale, ill appearing, nasal cannula present EYES: Pupils equal, round, reactive to light and accommodation. No scleral icterus. Extraocular muscles intact.  HEENT: Head atraumatic, normocephalic. Oropharynx and nasopharynx clear.  NECK:  Supple, no jugular venous distention. No thyroid enlargement, no tenderness.  LUNGS: Scattered bilateral crackles and wheezes, fair air movement, shallow respirations CARDIOVASCULAR: S1, S2 normal. No murmurs, rubs, or gallops.  ABDOMEN: Soft, nontender, nondistended. Bowel sounds present. No organomegaly or mass.  EXTREMITIES: No pedal edema, cyanosis, or clubbing.  NEUROLOGIC: Cranial nerves II through XII are intact. Muscle strength 5/5 in all extremities. Sensation intact. Gait not checked.  PSYCHIATRIC: The patient is alert and oriented x 3.  SKIN: No obvious  rash, lesion, or ulcer.    LABORATORY PANEL:   CBC  Recent Labs Lab 08/05/14 0957  WBC 10.2  HGB 11.7*  HCT 35.9  PLT 145*   ------------------------------------------------------------------------------------------------------------------  Chemistries   Recent Labs Lab 08/05/14 0957  NA 138  K 4.0  CL 93*  CO2 33*  GLUCOSE 93  BUN 17  CREATININE 1.05*  CALCIUM 8.3*   ------------------------------------------------------------------------------------------------------------------  Cardiac Enzymes  Recent Labs Lab 08/06/14 0607  TROPONINI 0.07*   ------------------------------------------------------------------------------------------------------------------  RADIOLOGY:  Dg Chest Portable 1 View  08/05/2014   CLINICAL DATA:  Shortness of breath.  Fall.  EXAM: PORTABLE CHEST - 1 VIEW  COMPARISON:  Jul 26, 2014.  FINDINGS: Stable cardiomegaly. No pneumothorax is noted. Stable interstitial densities are noted throughout both lungs consistent with pulmonary fibrosis. This is not significantly changed compared to prior exam, although superimposed inflammation or edema cannot be excluded. Minimal bilateral pleural effusions cannot be excluded. Bony thorax is intact.  IMPRESSION: Stable interstitial densities are noted throughout both lungs consistent with pulmonary fibrosis. This is unchanged compared to prior exam, although superimposed inflammation or edema cannot be excluded. Minimal bilateral pleural effusions may be present as well.   Electronically Signed   By: Jul 28, 2014, M.D.   On: 08/05/2014 10:26    EKG:   Orders placed or performed during the hospital encounter of 08/05/14  . EKG test  . EKG test  . EKG 12-Lead  . EKG 12-Lead    ASSESSMENT AND PLAN:    1. Bilateral healthcare acquired pneumonia with sepsis.  - Blood and sputum cultures pending - Continue vancomycin and Zosyn and azithromycin  #2 COPD: -  With chronic respiratory failure on 3-4  L of nasal cannula at home - Wheezing on admission started on Solu-Medrol, I will taper - Continue home inhalers and nebulizers, incentive spirometry - Add Spiriva on discharge  3 Elevated troponin.   -chronic elevation of troponin around 0.1.  - increased on admission to 0.26, trending down without intervention - No chest pain  4. Diastolic chf Stop lasix due to hypotension. Watch for fluid overload.  5. Hypertension Stop meds due to hypotension. - Start IV fluids and monitor carefully  6. DVT prophylaxis Lovenox  7. Goals of care: Appreciate palliative care consultation. This patient has had 7 admissions in the past 6 months she has multiple comorbidities including chronic respiratory failure, advanced COPD, rheumatoid arthritis and significant deconditioning.  All the records are reviewed and case discussed with Care Management/Social Workerr. Management plans discussed with the patient, family and they are in agreement.  CODE STATUS: Full  TOTAL TIME TAKING CARE OF THIS PATIENT: 35 minutes.   POSSIBLE D/C IN 2-3 DAYS, DEPENDING ON CLINICAL CONDITION.   Elby Showers M.D on 08/06/2014 at 1:18 PM  Between 7am to 6pm - Pager - [phone]  After 6pm go to [email or web address] - password [email or web address] [facility]  [place] [facility]  Office  [phone]  CC: Primary care physician; Rolm Gala, MD

## 2014-08-06 NOTE — Consult Note (Addendum)
Palliative Medicine Inpatient Consult Note   Name: Denise Duke Date: 08/06/2014 MRN: [id number]  DOB: [birth year]  Referring Physician: Gale Journey, MD  Palliative Care consult requested for this 81 y.o. female for goals of medical therapy in patient with 02-dependent COPD/pulmonary fibrosis admitted with acute on chronic respiratory failure   Denise Duke is an 81 yo woman with PMH of O2-dependent COPD/pulmonary fibrosis, diastolic dysfunction with h/o CHF, RA, h/o chest pain, GERD, IBS, deconditioning, HTN, hyperlipidemia, anxiety, TIA, s/p hysterectomy, cholecystectomy, L. knee sgy, hernia repair, B. foot sgy. She was admitted 08/05/14 with shortness of breath and is being treated for presumed pneumonia. At present, pt is sitting up in bed eating lunch. This is pt's 5th hospitalization in the past 4 months.   REVIEW OF SYSTEMS:  Pain: None Dyspnea:  Yes Nausea/Vomiting:  No Diarrhea:  No Constipation:   No Depression:   No Anxiety:   No Fatigue:   Yes  SOCIAL HISTORY: Pt is widowed. She lives at [facility]. Pt has a son. She has a sister in [place] who is involved in her care. Pt used to work in Designer, fashion/clothing.   reports that she has never smoked. Her smokeless tobacco use includes Snuff. She reports that she does not drink alcohol or use illicit drugs.  LEGAL DOCUMENTS:  Advance Directives:  Yes. Health Care Power of Attorney:  Yes., Cathie Hoops [phone].   CODE STATUS: DNR  PAST MEDICAL HISTORY: Past Medical History  Diagnosis Date  . CHF (congestive heart failure)   . TIA (transient ischemic attack)   . Anxiety   . COPD (chronic obstructive pulmonary disease)   . Hypertension   . Pulmonary fibrosis   . Hearing loss   . Arthritis   . GERD (gastroesophageal reflux disease)     PAST SURGICAL HISTORY:  Past Surgical History  Procedure Laterality Date  . Cholecystectomy    . Hernia repair    . Abdominal hysterectomy      ALLERGIES:  is allergic to  aspirin; levaquin; and sulfa antibiotics.  MEDICATIONS:  Current Facility-Administered Medications  Medication Dose Route Frequency Provider Last Rate Last Dose  . 0.9 %  sodium chloride infusion  250 mL Intravenous PRN Gale Journey, MD      . acetaminophen (TYLENOL) tablet 650 mg  650 mg Oral Q6H PRN Milagros Loll, MD       Or  . acetaminophen (TYLENOL) suppository 650 mg  650 mg Rectal Q6H PRN Srikar Sudini, MD      . albuterol (PROVENTIL) (2.5 MG/3ML) 0.083% nebulizer solution 3 mL  3 mL Nebulization Q6H PRN Srikar Sudini, MD      . alum & mag hydroxide-simeth (MAALOX/MYLANTA) 200-200-20 MG/5ML suspension 30 mL  30 mL Oral Q6H PRN Srikar Sudini, MD      . atorvastatin (LIPITOR) tablet 80 mg  80 mg Oral QHS Milagros Loll, MD   80 mg at 08/05/14 2246  . azithromycin (ZITHROMAX) tablet 250 mg  250 mg Oral Daily Milagros Loll, MD   250 mg at 08/06/14 0914  . calcium-vitamin D (OSCAL WITH D) 500-200 MG-UNIT per tablet 1 tablet  1 tablet Oral BID Milagros Loll, MD   1 tablet at 08/06/14 0913  . carvedilol (COREG) tablet 6.25 mg  6.25 mg Oral BID WC Milagros Loll, MD   6.25 mg at 08/06/14 1027  . celecoxib (CELEBREX) capsule 200 mg  200 mg Oral Daily PRN Milagros Loll, MD      . clonazePAM (  KLONOPIN) tablet 0.5 mg  0.5 mg Oral TID PRN Milagros Loll, MD   0.5 mg at 08/06/14 0928  . clopidogrel (PLAVIX) tablet 75 mg  75 mg Oral Daily Milagros Loll, MD   75 mg at 08/06/14 0912  . enoxaparin (LOVENOX) injection 40 mg  40 mg Subcutaneous Q24H Milagros Loll, MD   40 mg at 08/05/14 2247  . escitalopram (LEXAPRO) tablet 10 mg  10 mg Oral Daily Milagros Loll, MD   10 mg at 08/06/14 0913  . gabapentin (NEURONTIN) capsule 600 mg  600 mg Oral TID Milagros Loll, MD   600 mg at 08/06/14 0913  . ipratropium-albuterol (DUONEB) 0.5-2.5 (3) MG/3ML nebulizer solution 3 mL  3 mL Inhalation Q4H Srikar Sudini, MD   3 mL at 08/06/14 1206  . leflunomide (ARAVA) tablet 10 mg  10 mg Oral q morning - 10a Srikar Sudini,  MD      . loperamide (IMODIUM) capsule 2 mg  2 mg Oral QID PRN Srikar Sudini, MD      . methylPREDNISolone sodium succinate (SOLU-MEDROL) 125 mg/2 mL injection 60 mg  60 mg Intravenous Q12H Milagros Loll, MD   60 mg at 08/06/14 0528  . mometasone-formoterol (DULERA) 200-5 MCG/ACT inhaler 2 puff  2 puff Inhalation BID Milagros Loll, MD   2 puff at 08/06/14 0841  . montelukast (SINGULAIR) tablet 10 mg  10 mg Oral QHS Milagros Loll, MD   10 mg at 08/06/14 0032  . ondansetron (ZOFRAN) tablet 4 mg  4 mg Oral Q6H PRN Milagros Loll, MD       Or  . ondansetron (ZOFRAN) injection 4 mg  4 mg Intravenous Q6H PRN Srikar Sudini, MD      . pantoprazole (PROTONIX) EC tablet 40 mg  40 mg Oral q morning - 10a Milagros Loll, MD   40 mg at 08/06/14 0913  . piperacillin-tazobactam (ZOSYN) IVPB 3.375 g  3.375 g Intravenous 3 times per day Milagros Loll, MD   3.375 g at 08/06/14 0529  . potassium chloride (K-DUR) CR tablet 10 mEq  10 mEq Oral Daily Milagros Loll, MD   10 mEq at 08/06/14 0913  . pregabalin (LYRICA) capsule 25 mg  25 mg Oral TID Milagros Loll, MD   25 mg at 08/06/14 0913  . sodium chloride 0.9 % injection 3 mL  3 mL Intravenous Q12H Milagros Loll, MD   3 mL at 08/06/14 0038  . sodium chloride 0.9 % injection 3 mL  3 mL Intravenous Q12H Milagros Loll, MD   3 mL at 08/06/14 0038  . sodium chloride 0.9 % injection 3 mL  3 mL Intravenous PRN Srikar Sudini, MD      . traMADol (ULTRAM) tablet 50 mg  50 mg Oral BID PRN Milagros Loll, MD      . vancomycin (VANCOCIN) IVPB 750 mg/150 ml premix  750 mg Intravenous Q24H Srikar Sudini, MD   750 mg at 08/06/14 0055    Vital Signs: BP 96/52 mmHg  Pulse 84  Temp(Src) 98.1 F (36.7 C) (Oral)  Resp 18  Ht 4\' 9"  (1.448 m)  Wt 74.163 kg (163 lb 8 oz)  BMI 35.37 kg/m2  SpO2 94% Filed Weights   08/05/14 0935 08/05/14 1855 08/06/14 0515  Weight: 78.472 kg (173 lb) 75.161 kg (165 lb 11.2 oz) 74.163 kg (163 lb 8 oz)    Estimated body mass index is 35.37 kg/(m^2) as  calculated from the following:   Height as of this encounter: 4\' 9"  (1.448 m).  Weight as of this encounter: 74.163 kg (163 lb 8 oz).    PHYSICAL EXAM:  General: ill-appearing HEENT: OP clear, moist oral mucosa, poor dentition Neck: Trachea midline  Cardiovascular: regular rate and rhythm Pulmonary/Chest: poor air movement, no audible wheeze Abdominal: Soft, NTTP, + bowel sounds GU: No SP tenderness, No CVA tenderness Extremities: trace edema  Neurological: Grossly nonfocal Skin: no rashes Psychiatric:  A&O x 3   LABS: CBC:  Recent Labs Lab 08/05/14 0957  WBC 10.2  HGB 11.7*  HCT 35.9  PLT 145*   Comprehensive Metabolic Panel:  Recent Labs Lab 08/05/14 0957  NA 138  K 4.0  CL 93*  CO2 33*  GLUCOSE 93  BUN 17  CREATININE 1.05*  CALCIUM 8.3*    IMPRESSION: Ms. Artz is an 81 yo woman with PMH of O2-dependent COPD/pulmonary fibrosis, diastolic dysfunction with h/o CHF, RA, h/o chest pain, GERD, IBS, deconditioning, HTN, hyperlipidemia, anxiety, TIA, s/p hysterectomy, cholecystectomy, L. knee sgy, hernia repair, B. foot sgy. She was admitted 08/05/14 with shortness of breath and is being treated for presumed pneumonia. This is pt's 5th hospitalization in the past 4 months.   Pt known to me from previous hospitalization. She is a DNR (out-of-facility DNR in chart). Order entered. Her HCPOA is Cathie Hoops [phone] (document in SCM).  I spoke with both pt and her HCPOA. They both agree that pt would benefit from a higher level of care than ALF or additional assistance at ALF. Pt qualifies for hospice services and is in agreement with involvement of hospice at ALF. Hospice screen placed.   PLAN: 1. DNR 2. Hospice screen  REFERRALS TO BE ORDERED:  Social work   More than 50% of the visit was spent in counseling/coordination of care: YES  Time spent: 70 minutes

## 2014-08-06 NOTE — Clinical Social Work Note (Signed)
Clinical Social Work Assessment  Patient Details  Name: Denise Duke MRN: [id number] Date of Birth: [birth year]  Date of referral:  08/06/14               Reason for consult:  Facility Placement                Permission sought to share information with:  Facility Art therapist granted to share information::  Yes, Verbal Permission Granted  Name::      [facility]   Agency::     Relationship::     Contact Information:     Housing/Transportation Living arrangements for the past 2 months:  [place] of Information:  Patient Patient Interpreter Needed:  None Criminal Activity/Legal Involvement Pertinent to Current Situation/Hospitalization:  No - Comment as needed Significant Relationships:  Siblings, Friend Lives with:  Facility Resident Do you feel safe going back to the place where you live?  Yes Need for family participation in patient care:  Yes (Comment)  Care giving concerns: Patient lives at [facility].    Social Worker assessment / plan: Holiday representative (CSW) met with patient and her sister Vickii Chafe [phone] and long time friend and HPOA Helene Kelp [phone] were at bedside. CSW introduced self and explained role of CSW department. Patient has been admitted to the hospital 5 times in the last 4 months. Per Dr. Ermalinda Memos patient does qualify her hospice at [facility]. CSW discussed going back to ALF with hospice. Patient is agreeable to returning to [facility] with hospice following. Patient's sister and HPOA are also agreeable to Hospice at [facility]. CSW contacted [facility]. Per Thayer Headings patient can return with [place]/ [facility] following. CSW contacted [facility] and made her aware of above.   Plan is for patient to return to [facility] with hospice following. CSW will continue to follow and assist as needed.   Blima Rich, LCSWA [phone]  Employment  status:  Retired Forensic scientist:  Information systems manager, Medicaid In [place] PT Recommendations:  Not assessed at this time Information / Referral to community resources:  Other (Comment Required) (Hospice )  Patient/Family's Response to care:  Patient and family are agreeable to returning to [facility] with hospice.   Patient/Family's Understanding of and Emotional Response to Diagnosis, Current Treatment, and Prognosis: Patient was pleasant and thanked CSW for visit.   Emotional Assessment Appearance:  Appears older than stated age Attitude/Demeanor/Rapport:    Affect (typically observed):  Pleasant Orientation:  Oriented to Self, Oriented to Place, Oriented to  Time, Oriented to Situation Alcohol / Substance use:  Not Applicable Psych involvement (Current and /or in the community):  No (Comment)  Discharge Needs  Concerns to be addressed:  Discharge Planning Concerns Readmission within the last 30 days:   Yes Current discharge risk:  Chronically ill Barriers to Discharge:  Continued Medical Work up   Loralyn Freshwater, LCSW 08/06/2014, 4:04 PM

## 2014-08-06 NOTE — Progress Notes (Signed)
No complaints of pain thus far this shift.Pts groin area red and irritated. MD notified and cream ordered (see MAR).  Will continue to monitor.

## 2014-08-06 NOTE — Outcomes Assessment (Signed)
VSS, patient is A+O with no signs of distress and is hard of hearing.  perenium and breasts are excoriated.  IV antibiotics infusing. Denies pain at this time.  Tele SR in the 60's

## 2014-08-06 NOTE — Progress Notes (Signed)
Contacted MD no DVT prophylaxis ordered except for Lovenox, SCD's and Teds ordered since patient is non ambulatory.  Patient bladder scanned and MD advised give patient until 0600 pm and rescan if >400 then I&O.  Fluids added since patient intake is not a lot.

## 2014-08-06 NOTE — Progress Notes (Signed)
New referral for [facility] services after discharge received from [facility] following a Palliative Care consult. Denise Duke is an 81 year old woman who lives at [facility] on [address] in [place]. She was admitted to [facility] on 5/23 for treatment of pneumonia, this is her 5th admission in 4 months. She has a PMH of COPD oxygen dependent, diastolic dysfunction with h/o CHF, HTN, TIA, rheumatoid arthritis, chest pain, GERD, IBS, HLD, and anxiety. Writer met with Denise Duke in her room, she was lying in bed, alert, hard of hearing, oxygen on at 4 L via nasal cannula. Of note she used purse  lipped breathing after minimal conversation. Denise Duke reports that she is no longer able to ambulate due to the "weakness " in her legs. She stated they "just give out". She has not been out of the bed while hospitalized. Per Staff RN Ana patient is using the bedpan.  CSW reports that this is patient's baseline. Hospice services explained, and she agreed to services after discharge. Denise Duke asked that writer contact her Chauncey Reading, Nash Dimmer ([phone]) to explain services to her as well. Writer contacted Helene Kelp, services explained, questions answered, contact information given.  Patient currently has oxygen in place in her room at [facility], patient and Helene Kelp made aware that this would be changed to [facility] after admission. Patient information faxed to referral intake. Possible discharge tomorrow with [place] staff picking patient up, she has portable oxygen available. Will continue to follow. Thank you. Flo Shanks RN, BSN, [place] and Palliative [facility] of [place], [facility] [phone] c

## 2014-08-07 DIAGNOSIS — J189 Pneumonia, unspecified organism: Secondary | ICD-10-CM | POA: Diagnosis present

## 2014-08-07 DIAGNOSIS — J9620 Acute and chronic respiratory failure, unspecified whether with hypoxia or hypercapnia: Secondary | ICD-10-CM | POA: Diagnosis present

## 2014-08-07 LAB — BASIC METABOLIC PANEL
ANION GAP: 7 (ref 5–15)
BUN: 19 mg/dL (ref 6–20)
CHLORIDE: 101 mmol/L (ref 101–111)
CO2: 30 mmol/L (ref 22–32)
CREATININE: 0.95 mg/dL (ref 0.44–1.00)
Calcium: 8 mg/dL — ABNORMAL LOW (ref 8.9–10.3)
GFR calc Af Amer: >60 mL/min (ref >60)
GFR calc non Af Amer: 53 mL/min — ABNORMAL LOW (ref >60)
Glucose, Bld: 245 mg/dL — ABNORMAL HIGH (ref 65–99)
Potassium: 4 mmol/L (ref 3.5–5.1)
Sodium: 138 mmol/L (ref 135–145)

## 2014-08-07 LAB — CBC
HEMATOCRIT: 29.6 % — AB (ref 35.0–47.0)
Hemoglobin: 9.4 g/dL — ABNORMAL LOW (ref 12.0–16.0)
MCH: 29.3 pg (ref 26.0–34.0)
MCHC: 31.8 g/dL — AB (ref 32.0–36.0)
MCV: 92 fL (ref 80.0–100.0)
PLATELETS: 127 10*3/uL — AB (ref 150–440)
RBC: 3.22 MIL/uL — ABNORMAL LOW (ref 3.80–5.20)
RDW: 16.9 % — ABNORMAL HIGH (ref 11.5–14.5)
WBC: 5.8 10*3/uL (ref 3.6–11.0)

## 2014-08-07 LAB — URINE CULTURE

## 2014-08-07 MED ORDER — DOXYCYCLINE HYCLATE 100 MG PO TABS: 100.0000 mg | ORAL_TABLET | Freq: Two times a day (BID) | ORAL | Status: DC

## 2014-08-07 MED ORDER — NYSTATIN 100000 UNIT/GM EX CREA: 1.0000 "application " | TOPICAL_CREAM | Freq: Two times a day (BID) | CUTANEOUS | Status: DC

## 2014-08-07 MED ORDER — FUROSEMIDE 20 MG PO TABS: 20.0000 mg | ORAL_TABLET | Freq: Every day | ORAL | Status: AC

## 2014-08-07 MED ORDER — AMOXICILLIN-POT CLAVULANATE 500-125 MG PO TABS: 1.0000 | ORAL_TABLET | Freq: Three times a day (TID) | ORAL | Status: DC

## 2014-08-07 MED ORDER — PREDNISONE 50 MG PO TABS: ORAL_TABLET | ORAL | Status: DC

## 2014-08-07 MED ORDER — PREDNISONE 50 MG PO TABS: 50.0000 mg | ORAL_TABLET | Freq: Every day | ORAL | Status: DC

## 2014-08-07 MED ORDER — CLONAZEPAM 0.5 MG PO TABS: 0.5000 mg | ORAL_TABLET | Freq: Three times a day (TID) | ORAL | Status: DC | PRN

## 2014-08-07 NOTE — Progress Notes (Signed)
Patient is medically stable for D/C back to [facility] with / [facility] following. [facility] [address] agreed to take back and will provide transportation between 1 and 2 pm. [facility] staff will bring a change of clothes and portable oxygen tank. Clinical Child psychotherapist (CSW) prepared D/C packet and faxed D/C Summary, FL2 and prescriptions to [facility]. RN called report to Public relations account executive at [facility]. Patient is aware of above. CSW contacted Rosey Bath patient's friend and HPOA and made her aware of above. Please reconsult if future social work needs arise. CSW signing off.   Jetta Lout, LCSWA [phone]

## 2014-08-07 NOTE — Progress Notes (Signed)
Pt discharged home. DC instructions provided and explained. Medications reviewed. Rx given. All questions answered. Pt stable at discharge.  

## 2014-08-07 NOTE — Progress Notes (Signed)
ANTIBIOTIC CONSULT NOTE  Pharmacy Consult for Vancomycin/Zosyn Indication: HCAP  Allergies  Allergen Reactions  . Aspirin Shortness Of Breath  . Levaquin [Levofloxacin In D5w] Other (See Comments)    GI Distress, Cramps  . Sulfa Antibiotics Other (See Comments)    unknown    Patient Measurements: Height: 4\' 9"  (144.8 cm) Weight: 172 lb 4.8 oz (78.155 kg) IBW/kg (Calculated) : 38.6 Adjusted Body Weight: 58.7 kg  Vital Signs: Temp: 97.7 F (36.5 C) (05/25 0725) Temp Source: Oral (05/25 0725) BP: 122/56 mmHg (05/25 0725) Pulse Rate: 83 (05/25 0725)  Labs:  Recent Labs  08/05/14 0957 08/07/14 0536  WBC 10.2 5.8  HGB 11.7* 9.4*  PLT 145* 127*  CREATININE 1.05* 0.95   Estimated Creatinine Clearance: 37.2 mL/min (by C-G formula based on Cr of 0.95).  Microbiology: Recent Results (from the past 240 hour(s))  C difficile quick scan w PCR reflex [facility])     Status: None   Collection Time: 08/05/14 10:02 AM  Result Value Ref Range Status   C Diff antigen NEGATIVE  Final   C Diff toxin Negative for C. difficile  Final   C Diff interpretation NEGATIVE  Final  Urine culture     Status: None (Preliminary result)   Collection Time: 08/05/14 10:02 AM  Result Value Ref Range Status   Specimen Description URINE, CLEAN CATCH  Final   Special Requests NONE  Final   Culture NO GROWTH < 24 HOURS  Final   Report Status PENDING  Incomplete  Blood culture (routine x 2)     Status: None (Preliminary result)   Collection Time: 08/05/14 10:04 AM  Result Value Ref Range Status   Specimen Description BLOOD  Final   Special Requests Normal  Final   Culture NO GROWTH < 24 HOURS  Final   Report Status PENDING  Incomplete  Blood culture (routine x 2)     Status: None (Preliminary result)   Collection Time: 08/05/14 10:04 AM  Result Value Ref Range Status   Specimen Description BLOOD  Final   Special Requests Normal  Final   Culture NO GROWTH < 24 HOURS  Final   Report Status PENDING   Incomplete    Medications:  Scheduled:  . atorvastatin  80 mg Oral QHS  . azithromycin  250 mg Oral Daily  . calcium-vitamin D  1 tablet Oral BID  . carvedilol  6.25 mg Oral BID WC  . clopidogrel  75 mg Oral Daily  . enoxaparin (LOVENOX) injection  40 mg Subcutaneous Q24H  . escitalopram  10 mg Oral Daily  . gabapentin  600 mg Oral TID  . ipratropium-albuterol  3 mL Inhalation Q4H  . leflunomide  10 mg Oral q morning - 10a  . methylPREDNISolone (SOLU-MEDROL) injection  60 mg Intravenous Q12H  . mometasone-formoterol  2 puff Inhalation BID  . montelukast  10 mg Oral QHS  . nystatin cream  1 application Topical BID  . pantoprazole  40 mg Oral q morning - 10a  . piperacillin-tazobactam (ZOSYN)  IV  3.375 g Intravenous 3 times per day  . potassium chloride  10 mEq Oral Daily  . pregabalin  25 mg Oral TID  . sodium chloride  3 mL Intravenous Q12H  . vancomycin  750 mg Intravenous Q24H   Infusions:  . sodium chloride 75 mL/hr at 08/06/14 1755   Assessment: Patient with recent hospital admission being treated for HCAP with vancomycin, zosyn, and azithromycin  Goal of Therapy:  Vancomycin trough  level 15-20 mcg/ml  Plan:  Continue vancomycin 750 mg iv q 24 h with stacked dosing. Trough prior to 4th dose. This will not be steady state.   Continue zosyn 3.375gm IV Q8H EI  Garlon Hatchet, PharmD Clinical Pharmacist 08/07/2014,9:42 AM

## 2014-08-07 NOTE — Progress Notes (Signed)
Patient is alert and oriented x4. Pain controled with Tylenol see mar. Patient complains of anxiety Klonopin giving see mar. patinet is on 4L Spo2 , cough non productive.

## 2014-08-07 NOTE — Discharge Summary (Addendum)
[facility] [place]   PATIENT NAME: Denise Duke    MR#:  [id number]  DATE OF BIRTH:  [birth year]  DATE OF ADMISSION:  08/05/2014 ADMITTING PHYSICIAN: Hillary Bow, MD  DATE OF DISCHARGE: 08/07/2014  PRIMARY CARE PHYSICIAN: Hortencia Pilar, MD    ADMISSION DIAGNOSIS:  Other specified hypotension [I95.89] Elevated troponin I level [R79.89] Sepsis, due to unspecified organism [A41.9]  DISCHARGE DIAGNOSIS:  Principal Problem:   HCAP (healthcare-associated pneumonia) Active Problems:   Sepsis   Atrial fibrillation   COPD exacerbation   Acute-on-chronic respiratory failure   SECONDARY DIAGNOSIS:   Past Medical History  Diagnosis Date  . CHF (congestive heart failure)   . TIA (transient ischemic attack)   . Anxiety   . COPD (chronic obstructive pulmonary disease)   . Hypertension   . Pulmonary fibrosis   . Hearing loss   . Arthritis   . GERD (gastroesophageal reflux disease)     HOSPITAL COURSE:     1. Bilateral healthcare acquired pneumonia with SIRS. On admission met SIRS criteria with hypotension, fever, pneumonia. Due to multiple recent admissions and last discharge being only 4 days prior this is a hospital or healthcare acquired pneumonia. Blood and sputum cultures are negative so far. She's been treated with Zosyn, vancomycin and azithromycin during the hospitalization. On discharge being transitioned to oral doxycycline and Augmentin to complete a 10 day course  #2 COPD with chronic respiratory failure on 3-4 L of nasal cannula. She did exhibit wheezing on admission was started on Solu-Medrol. She is being discharged on prednisone 50 mg 5 days. She'll continue home inhalers and nebulizers as well as incentive spirometry. Currently on 4 L nasal cannula with sats in the mid 90%.  3 Elevated troponin. She has a chronic elevation of troponin around 0.1. This was slightly higher on admission at 0.26,  trending down without intervention. No chest pain diaphoresis left arm or jaw pain. This is not likely ACS. Continue aspirin and Plavix  4. Diastolic chf: No signs of exacerbation during this hospitalization. Lasix was held due to relative hypotension. Would resume on a when necessary basis and based on blood pressure and weight rather than give daily.  5. Hypertension on blood pressures have returned to normal can resume home antihypertensives with the exception of Lasix which should be made when necessary.  6. Atrial fibrillation: Rate controlled. Continue Coreg. No anticoagulation  7. Recent urinary tract infection with Proteus and Klebsiella: She had been on Keflex prior to this admission for treatment which was started on May 15, on arrival here she was treated with Zosyn throughout the hospitalization stopping on May 25. This would be a 10 day course of coverage. UA on admission showed 0-5 white blood cells +1 leukocyte esterase and culture is pending. Course complete.  8. Goals of care: Appreciate palliative care consultation. This patient has had 7 admissions in the past 6 months she has multiple comorbidities including chronic respiratory failure, advanced COPD, rheumatoid arthritis and significant deconditioning.  DISCHARGE CONDITIONS:   Guarded  CONSULTS OBTAINED:  Treatment Team:  [place] Jack Phifer, MD  DRUG ALLERGIES:   Allergies  Allergen Reactions  . Aspirin Shortness Of Breath  . Levaquin [Levofloxacin In D5w] Other (See Comments)    GI Distress, Cramps  . Sulfa Antibiotics Other (See Comments)    unknown    DISCHARGE MEDICATIONS:   Current Discharge Medication List    START taking these medications   Details  amoxicillin-clavulanate (AUGMENTIN) 500-125 MG per tablet Take 1 tablet (500 mg total) by mouth 3 (three) times daily. Qty: 20 tablet, Refills: 0    doxycycline (VIBRA-TABS) 100 MG tablet Take 1 tablet (100 mg total) by mouth 2 (two) times daily. Qty: 20  tablet, Refills: 0    nystatin cream (MYCOSTATIN) Apply 1 application topically 2 (two) times daily. Qty: 30 g, Refills: 0    predniSONE (DELTASONE) 50 MG tablet Take one tablet daily Qty: 5 tablet, Refills: 0      CONTINUE these medications which have CHANGED   Details  !! clonazePAM (KLONOPIN) 0.5 MG tablet Take 1 tablet (0.5 mg total) by mouth 3 (three) times daily as needed (nervousness). Qty: 90 tablet, Refills: 0    furosemide (LASIX) 20 MG tablet Take 1 tablet (20 mg total) by mouth daily. Qty: 30 tablet, Refills: 6     !! - Potential duplicate medications found. Please discuss with provider.    CONTINUE these medications which have NOT CHANGED   Details  acetaminophen (TYLENOL) 500 MG tablet Take 500 mg by mouth 2 (two) times daily as needed (pain).    albuterol (PROVENTIL) (2.5 MG/3ML) 0.083% nebulizer solution Take 3 mLs by nebulization every 6 (six) hours as needed for wheezing.    albuterol-ipratropium (COMBIVENT) 18-103 MCG/ACT inhaler Inhale into the lungs every 4 (four) hours.    alendronate (FOSAMAX) 70 MG tablet Take 70 mg by mouth once a week. On Tuesdays.Take with a full glass of water on an empty stomach. Do not lie down for 30 minutes after dose.    atorvastatin (LIPITOR) 80 MG tablet Take 80 mg by mouth at bedtime.    Calcium Carbonate-Vitamin D 600-400 MG-UNIT per tablet Take 1 tablet by mouth 2 (two) times daily.    capsaicin (ZOSTRIX) 0.025 % cream Apply topically 2 (two) times daily. Apply topically to affected area 2 times a day, as needed for pain    carvedilol (COREG) 6.25 MG tablet Take 1 tablet (6.25 mg total) by mouth 2 (two) times daily with a meal. Qty: 60 tablet, Refills: 1    celecoxib (CELEBREX) 200 MG capsule Take 200 mg by mouth daily as needed (as needed for pain).    !! clonazePAM (KLONOPIN) 0.5 MG tablet Take 1 tablet (0.5 mg total) by mouth 3 (three) times daily as needed (nervousness). Qty: 30 tablet, Refills: 0    clopidogrel  (PLAVIX) 75 MG tablet Take 75 mg by mouth daily.    CRANBERRY EXTRACT PO Take 425 mg by mouth daily. Cranberry tablet    escitalopram (LEXAPRO) 10 MG tablet Take 10 mg by mouth daily.    Fluticasone-Salmeterol (ADVAIR) 500-50 MCG/DOSE AEPB Inhale 1 puff into the lungs 2 (two) times daily.    gabapentin (NEURONTIN) 600 MG tablet Take 600 mg by mouth 3 (three) times daily.    isosorbide mononitrate (IMDUR) 30 MG 24 hr tablet Take 30 mg by mouth daily.    leflunomide (ARAVA) 20 MG tablet Take 10 mg by mouth every morning. Take half tab of 66m    lisinopril (PRINIVIL,ZESTRIL) 2.5 MG tablet Take 1.25 mg by mouth daily. Take half tab of 2.550m   loperamide (IMODIUM) 2 MG capsule Take by mouth 4 (four) times daily as needed for diarrhea or loose stools.    montelukast (SINGULAIR) 10 MG tablet Take 10 mg by mouth at bedtime.    pantoprazole (PROTONIX) 40 MG tablet Take 40 mg by mouth every morning.    potassium chloride (K-DUR) 10  MEQ tablet Take 10 mEq by mouth daily.    pregabalin (LYRICA) 25 MG capsule Take 25 mg by mouth 3 (three) times daily.    Probiotic Product (PROBIOTIC FORMULA PO) Take by mouth every morning. Probiotic Formula-oral cap    traMADol (ULTRAM) 50 MG tablet Take 1 tablet (50 mg total) by mouth 2 (two) times daily as needed for moderate pain (pain). Qty: 30 tablet, Refills: 0     !! - Potential duplicate medications found. Please discuss with provider.    STOP taking these medications     cephALEXin (KEFLEX) 500 MG capsule      methylPREDNISolone (MEDROL DOSEPAK) 4 MG TBPK tablet          DISCHARGE INSTRUCTIONS:   DIET:  Cardiac diet, less than 2 g sodium  DISCHARGE CONDITION:  Serious  ACTIVITY:  Activity as tolerated  OXYGEN:  Home Oxygen: Yes.     Oxygen Delivery: 3-4 liters/min via Patient connected to nasal cannula oxygen  DISCHARGE LOCATION:  nursing home with hospice following  If you experience worsening of your admission symptoms,  develop shortness of breath, life threatening emergency, suicidal or homicidal thoughts you must seek medical attention immediately by calling 911 or calling your MD immediately  if symptoms less severe.  You Must read complete instructions/literature along with all the possible adverse reactions/side effects for all the Medicines you take and that have been prescribed to you. Take any new Medicines after you have completely understood and accpet all the possible adverse reactions/side effects.   Please note  You were cared for by a hospitalist during your hospital stay. If you have any questions about your discharge medications or the care you received while you were in the hospital after you are discharged, you can call the unit and asked to speak with the hospitalist on call if the hospitalist that took care of you is not available. Once you are discharged, your primary care physician will handle any further medical issues. Please note that NO REFILLS for any discharge medications will be authorized once you are discharged, as it is imperative that you return to your primary care physician (or establish a relationship with a primary care physician if you do not have one) for your aftercare needs so that they can reassess your need for medications and monitor your lab values.    Today   CHIEF COMPLAINT:   Chief Complaint  Patient presents with  . Shortness of Breath  . Fall    HISTORY OF PRESENT ILLNESS:  Stanisha Lorenz is a 81 y.o. female with a known history of CHF, COPD, hypertension and TIA presents to the emergency room with shortness of breath, increasing weakness and fall. Patient was recently in the hospital for septic shock and urinary tract infection with multiple bacteria in his urine. Discharged on Keflex. Seems to have improved but then started worsening with weakness and had a fall. Did not hit her head or lose consciousness. Presently still she mentions that her shortness of  breath is back to normal she has no pain.  Her blood pressure was found to be low normal with systolic of 90 heart rate in 120s. Fever 101.9 on arrival to the emergency room. Chest x-ray shows bilateral pneumonia and is being admitted for healthcare acquired pneumonia and sepsis.  Baseline she wears 3-4 L oxygen due to her pulmonary fibrosis. And was sent to assisted living facility with home health PT for worsening weakness during recent discharge.  Patient received 500 mL  normal saline bolus in the emergency room.   VITAL SIGNS:  Blood pressure 110/60, pulse 83, temperature 98.4 F (36.9 C), temperature source Oral, resp. rate 20, height 4' 9"  (1.448 m), weight 78.155 kg (172 lb 4.8 oz), SpO2 95 %.  I/O:    Intake/Output Summary (Last 24 hours) at 08/07/14 1345 Last data filed at 08/07/14 0425  Gross per 24 hour  Intake    240 ml  Output   1250 ml  Net  -1010 ml    PHYSICAL EXAMINATION:  GENERAL: 81 y.o.-year-old patient lying in the bed with no acute distress, pale, ill appearing, nasal cannula present EYES: Pupils equal, round, reactive to light and accommodation. No scleral icterus. Extraocular muscles intact.  HEENT: Head atraumatic, normocephalic. Oropharynx and nasopharynx clear.  NECK: Supple, no jugular venous distention. No thyroid enlargement, no tenderness.  LUNGS: Scattered bilateral crackles and wheezes, fair air movement, shallow respirations CARDIOVASCULAR: S1, S2 normal. No murmurs, rubs, or gallops.  ABDOMEN: Soft, nontender, nondistended. Bowel sounds present. No organomegaly or mass.  EXTREMITIES: No pedal edema, cyanosis, or clubbing.  NEUROLOGIC: Cranial nerves II through XII are intact. Muscle strength 5/5 in all extremities. Sensation intact. Gait not checked.  PSYCHIATRIC: The patient is alert and oriented x 3.  SKIN: No obvious rash, lesion, or ulcer.    DATA REVIEW:   CBC  Recent Labs Lab 08/07/14 0536  WBC 5.8  HGB 9.4*  HCT 29.6*   PLT 127*    Chemistries   Recent Labs Lab 08/07/14 0536  NA 138  K 4.0  CL 101  CO2 30  GLUCOSE 245*  BUN 19  CREATININE 0.95  CALCIUM 8.0*    Cardiac Enzymes  Recent Labs Lab 08/06/14 0607  TROPONINI 0.07*    Microbiology Results  Results for orders placed or performed during the hospital encounter of 08/05/14  C difficile quick scan w PCR reflex [facility])     Status: None   Collection Time: 08/05/14 10:02 AM  Result Value Ref Range Status   C Diff antigen NEGATIVE  Final   C Diff toxin Negative for C. difficile  Final   C Diff interpretation NEGATIVE  Final  Urine culture     Status: None   Collection Time: 08/05/14 10:02 AM  Result Value Ref Range Status   Specimen Description URINE, CLEAN CATCH  Final   Special Requests NONE  Final   Culture 7,000 COLONIES/mL INSIGNIFICANT GROWTH  Final   Report Status 08/07/2014 FINAL  Final  Blood culture (routine x 2)     Status: None (Preliminary result)   Collection Time: 08/05/14 10:04 AM  Result Value Ref Range Status   Specimen Description BLOOD  Final   Special Requests Normal  Final   Culture NO GROWTH < 24 HOURS  Final   Report Status PENDING  Incomplete  Blood culture (routine x 2)     Status: None (Preliminary result)   Collection Time: 08/05/14 10:04 AM  Result Value Ref Range Status   Specimen Description BLOOD  Final   Special Requests Normal  Final   Culture NO GROWTH < 24 HOURS  Final   Report Status PENDING  Incomplete    RADIOLOGY:  No results found.  EKG:   Orders placed or performed during the hospital encounter of 08/05/14  . EKG test  . EKG test  . EKG 12-Lead  . EKG 12-Lead      Management plans discussed with the patient, family and they are in agreement.  CODE STATUS:     Code Status Orders        Start     Ordered   08/06/14 1342  Do not attempt resuscitation (DNR)   Continuous    Question Answer Comment  In the event of cardiac or respiratory ARREST Do not call a "code  blue"   In the event of cardiac or respiratory ARREST Do not perform Intubation, CPR, defibrillation or ACLS   In the event of cardiac or respiratory ARREST Use medication by any route, position, wound care, and other measures to relive pain and suffering. May use oxygen, suction and manual treatment of airway obstruction as needed for comfort.      08/06/14 1342    Advance Directive Documentation        Most Recent Value   Type of Advance Directive  Out of facility DNR (pink MOST or yellow form)   Pre-existing out of facility DNR order (yellow form or pink MOST form)  Yellow form placed in chart (order not valid for inpatient use)   "MOST" Form in Place?        TOTAL TIME TAKING CARE OF THIS PATIENT: 55 minutes.    Myrtis Ser M.D on 08/07/2014 at 1:45 PM  Between 7am to 6pm - Pager - [phone]  After 6pm go to [email or web address] - password [email or web address] [facility]  Office  [phone]  CC: Primary care physician; Hortencia Pilar, MD

## 2014-08-07 NOTE — Progress Notes (Signed)
Follow up visit made on new referral for [facility] services after discharge. Patient seen sitting up in bed,alert and oriented breakfast tray on over bed table. Staff RN's in to reposition patient so she could reach her food. Patient appears less dyspneic than at visit yesterday, O2 remains on at 4 liters via nasal cannula.Patient with good appetite, denied pain. Possible discharge today back to [facility] per CSW [place]. Will continue to follow.

## 2014-08-08 LAB — URINE CULTURE: Culture: 4000

## 2014-08-10 LAB — CULTURE, BLOOD (ROUTINE X 2)
CULTURE: NO GROWTH
Culture: NO GROWTH
SPECIAL REQUESTS: NORMAL
SPECIAL REQUESTS: NORMAL

## 2014-08-15 ENCOUNTER — Inpatient Hospital Stay

## 2014-08-15 ENCOUNTER — Emergency Department

## 2014-08-15 ENCOUNTER — Encounter: Payer: Self-pay | Admitting: Emergency Medicine

## 2014-08-15 ENCOUNTER — Inpatient Hospital Stay
Admission: EM | Admit: 2014-08-15 | Discharge: 2014-08-17 | DRG: 871 | Disposition: A | Discharge status: Hospice | Attending: Internal Medicine | Admitting: Internal Medicine

## 2014-08-15 DIAGNOSIS — Z8249 Family history of ischemic heart disease and other diseases of the circulatory system: Secondary | ICD-10-CM | POA: Diagnosis not present

## 2014-08-15 DIAGNOSIS — Z452 Encounter for adjustment and management of vascular access device: Secondary | ICD-10-CM

## 2014-08-15 DIAGNOSIS — Z7952 Long term (current) use of systemic steroids: Secondary | ICD-10-CM | POA: Diagnosis not present

## 2014-08-15 DIAGNOSIS — Z9981 Dependence on supplemental oxygen: Secondary | ICD-10-CM

## 2014-08-15 DIAGNOSIS — Z66 Do not resuscitate: Secondary | ICD-10-CM | POA: Diagnosis present

## 2014-08-15 DIAGNOSIS — Z515 Encounter for palliative care: Secondary | ICD-10-CM

## 2014-08-15 DIAGNOSIS — Z8673 Personal history of transient ischemic attack (TIA), and cerebral infarction without residual deficits: Secondary | ICD-10-CM | POA: Diagnosis not present

## 2014-08-15 DIAGNOSIS — N170 Acute kidney failure with tubular necrosis: Secondary | ICD-10-CM | POA: Diagnosis present

## 2014-08-15 DIAGNOSIS — Z7951 Long term (current) use of inhaled steroids: Secondary | ICD-10-CM | POA: Diagnosis not present

## 2014-08-15 DIAGNOSIS — J9621 Acute and chronic respiratory failure with hypoxia: Secondary | ICD-10-CM | POA: Diagnosis present

## 2014-08-15 DIAGNOSIS — J9620 Acute and chronic respiratory failure, unspecified whether with hypoxia or hypercapnia: Secondary | ICD-10-CM

## 2014-08-15 DIAGNOSIS — I959 Hypotension, unspecified: Secondary | ICD-10-CM | POA: Diagnosis present

## 2014-08-15 DIAGNOSIS — I5032 Chronic diastolic (congestive) heart failure: Secondary | ICD-10-CM | POA: Diagnosis present

## 2014-08-15 DIAGNOSIS — R6521 Severe sepsis with septic shock: Secondary | ICD-10-CM | POA: Diagnosis present

## 2014-08-15 DIAGNOSIS — H9190 Unspecified hearing loss, unspecified ear: Secondary | ICD-10-CM | POA: Diagnosis present

## 2014-08-15 DIAGNOSIS — K589 Irritable bowel syndrome without diarrhea: Secondary | ICD-10-CM | POA: Diagnosis present

## 2014-08-15 DIAGNOSIS — J8410 Pulmonary fibrosis, unspecified: Secondary | ICD-10-CM | POA: Diagnosis present

## 2014-08-15 DIAGNOSIS — J449 Chronic obstructive pulmonary disease, unspecified: Secondary | ICD-10-CM | POA: Diagnosis not present

## 2014-08-15 DIAGNOSIS — I10 Essential (primary) hypertension: Secondary | ICD-10-CM | POA: Diagnosis present

## 2014-08-15 DIAGNOSIS — F17290 Nicotine dependence, other tobacco product, uncomplicated: Secondary | ICD-10-CM | POA: Diagnosis present

## 2014-08-15 DIAGNOSIS — I509 Heart failure, unspecified: Secondary | ICD-10-CM

## 2014-08-15 DIAGNOSIS — M1990 Unspecified osteoarthritis, unspecified site: Secondary | ICD-10-CM | POA: Diagnosis present

## 2014-08-15 DIAGNOSIS — K219 Gastro-esophageal reflux disease without esophagitis: Secondary | ICD-10-CM | POA: Diagnosis present

## 2014-08-15 DIAGNOSIS — E785 Hyperlipidemia, unspecified: Secondary | ICD-10-CM | POA: Diagnosis present

## 2014-08-15 DIAGNOSIS — A419 Sepsis, unspecified organism: Secondary | ICD-10-CM | POA: Diagnosis present

## 2014-08-15 DIAGNOSIS — F419 Anxiety disorder, unspecified: Secondary | ICD-10-CM | POA: Diagnosis present

## 2014-08-15 DIAGNOSIS — R0902 Hypoxemia: Secondary | ICD-10-CM

## 2014-08-15 LAB — CBC WITH DIFFERENTIAL/PLATELET
Basophils Absolute: 0 10*3/uL (ref 0–0.1)
Basophils Relative: 0 %
EOS ABS: 0.1 10*3/uL (ref 0–0.7)
EOS PCT: 1 %
HEMATOCRIT: 36.9 % (ref 35.0–47.0)
Hemoglobin: 11.8 g/dL — ABNORMAL LOW (ref 12.0–16.0)
Lymphocytes Relative: 11 %
Lymphs Abs: 0.9 10*3/uL — ABNORMAL LOW (ref 1.0–3.6)
MCH: 29.3 pg (ref 26.0–34.0)
MCHC: 32.1 g/dL (ref 32.0–36.0)
MCV: 91.3 fL (ref 80.0–100.0)
MONO ABS: 0.5 10*3/uL (ref 0.2–0.9)
Monocytes Relative: 6 %
NEUTROS PCT: 82 %
Neutro Abs: 6.9 10*3/uL — ABNORMAL HIGH (ref 1.4–6.5)
Platelets: 166 10*3/uL (ref 150–440)
RBC: 4.05 MIL/uL (ref 3.80–5.20)
RDW: 17.4 % — AB (ref 11.5–14.5)
WBC: 8.5 10*3/uL (ref 3.6–11.0)

## 2014-08-15 LAB — GLUCOSE, CAPILLARY: GLUCOSE-CAPILLARY: 91 mg/dL (ref 65–99)

## 2014-08-15 LAB — COMPREHENSIVE METABOLIC PANEL
ALBUMIN: 3 g/dL — AB (ref 3.5–5.0)
ALK PHOS: 96 U/L (ref 38–126)
ALT: 27 U/L (ref 14–54)
AST: 38 U/L (ref 15–41)
Anion gap: 12 (ref 5–15)
BUN: 33 mg/dL — ABNORMAL HIGH (ref 6–20)
CALCIUM: 7.4 mg/dL — AB (ref 8.9–10.3)
CO2: 32 mmol/L (ref 22–32)
Chloride: 94 mmol/L — ABNORMAL LOW (ref 101–111)
Creatinine, Ser: 1.83 mg/dL — ABNORMAL HIGH (ref 0.44–1.00)
GFR, EST AFRICAN AMERICAN: 28 mL/min — AB (ref >60)
GFR, EST NON AFRICAN AMERICAN: 24 mL/min — AB (ref >60)
Glucose, Bld: 91 mg/dL (ref 65–99)
POTASSIUM: 4.3 mmol/L (ref 3.5–5.1)
SODIUM: 138 mmol/L (ref 135–145)
Total Bilirubin: 0.9 mg/dL (ref 0.3–1.2)
Total Protein: 6.6 g/dL (ref 6.5–8.1)

## 2014-08-15 LAB — URINALYSIS COMPLETE WITH MICROSCOPIC (ARMC ONLY)
Bacteria, UA: NONE SEEN
Bilirubin Urine: NEGATIVE
GLUCOSE, UA: NEGATIVE mg/dL
Ketones, ur: NEGATIVE mg/dL
LEUKOCYTES UA: NEGATIVE
NITRITE: NEGATIVE
PH: 5 (ref 5.0–8.0)
PROTEIN: NEGATIVE mg/dL
SPECIFIC GRAVITY, URINE: 1.014 (ref 1.005–1.030)
SQUAMOUS EPITHELIAL / LPF: NONE SEEN

## 2014-08-15 LAB — LACTIC ACID, PLASMA
LACTIC ACID, VENOUS: 2.1 mmol/L — AB (ref 0.5–2.0)
LACTIC ACID, VENOUS: 2.2 mmol/L — AB (ref 0.5–2.0)

## 2014-08-15 LAB — TROPONIN I: Troponin I: 0.15 ng/mL — ABNORMAL HIGH (ref <0.031)

## 2014-08-15 LAB — BRAIN NATRIURETIC PEPTIDE: B Natriuretic Peptide: 189 pg/mL — ABNORMAL HIGH (ref 0.0–100.0)

## 2014-08-15 MED ORDER — PIPERACILLIN-TAZOBACTAM 3.375 G IVPB
3.3750 g | Freq: Three times a day (TID) | INTRAVENOUS | Status: DC
  Administered 2014-08-15 – 2014-08-16 (×3): 3.375 g via INTRAVENOUS
  Filled 2014-08-15 (×6): qty 50

## 2014-08-15 MED ORDER — ACETAMINOPHEN 650 MG RE SUPP
650.0000 mg | RECTAL | Status: DC | PRN
  Administered 2014-08-15: 650 mg via RECTAL
  Filled 2014-08-15: qty 1

## 2014-08-15 MED ORDER — CLONAZEPAM 0.5 MG PO TABS: 0.5000 mg | ORAL_TABLET | Freq: Three times a day (TID) | ORAL | Status: DC | PRN

## 2014-08-15 MED ORDER — PIPERACILLIN-TAZOBACTAM 3.375 G IVPB
INTRAVENOUS | Status: AC
  Administered 2014-08-15: 3.375 g via INTRAVENOUS
  Filled 2014-08-15: qty 50

## 2014-08-15 MED ORDER — CLOPIDOGREL BISULFATE 75 MG PO TABS
75.0000 mg | ORAL_TABLET | Freq: Every day | ORAL | Status: DC
  Administered 2014-08-16: 75 mg via ORAL
  Filled 2014-08-15: qty 1

## 2014-08-15 MED ORDER — NOREPINEPHRINE BITARTRATE 1 MG/ML IV SOLN
5.0000 ug/min | INTRAVENOUS | Status: DC
  Filled 2014-08-15: qty 4

## 2014-08-15 MED ORDER — HEPARIN SODIUM (PORCINE) 5000 UNIT/ML IJ SOLN
5000.0000 [IU] | Freq: Three times a day (TID) | INTRAMUSCULAR | Status: DC
  Administered 2014-08-16 (×2): 5000 [IU] via SUBCUTANEOUS
  Filled 2014-08-15 (×2): qty 1

## 2014-08-15 MED ORDER — SODIUM CHLORIDE 0.9 % IV BOLUS (SEPSIS)
1000.0000 mL | Freq: Once | INTRAVENOUS | Status: AC
  Administered 2014-08-15: 1000 mL via INTRAVENOUS

## 2014-08-15 MED ORDER — VANCOMYCIN HCL IN DEXTROSE 1-5 GM/200ML-% IV SOLN
INTRAVENOUS | Status: AC
  Administered 2014-08-15: 1000 mg via INTRAVENOUS
  Filled 2014-08-15: qty 200

## 2014-08-15 MED ORDER — VANCOMYCIN HCL IN DEXTROSE 750-5 MG/150ML-% IV SOLN
750.0000 mg | INTRAVENOUS | Status: DC
  Administered 2014-08-15: 750 mg via INTRAVENOUS
  Filled 2014-08-15 (×2): qty 150

## 2014-08-15 MED ORDER — SODIUM CHLORIDE 0.9 % IV SOLN
Freq: Once | INTRAVENOUS | Status: AC
  Administered 2014-08-15: 13:00:00 via INTRAVENOUS

## 2014-08-15 MED ORDER — CLONAZEPAM 0.5 MG PO TABS
0.5000 mg | ORAL_TABLET | Freq: Three times a day (TID) | ORAL | Status: DC | PRN
  Administered 2014-08-16: 0.5 mg via ORAL
  Filled 2014-08-15: qty 1

## 2014-08-15 MED ORDER — PIPERACILLIN-TAZOBACTAM 3.375 G IVPB
3.3750 g | Freq: Once | INTRAVENOUS | Status: AC
  Administered 2014-08-15: 3.375 g via INTRAVENOUS

## 2014-08-15 MED ORDER — PIPERACILLIN-TAZOBACTAM 3.375 G IVPB
3.3750 g | Freq: Three times a day (TID) | INTRAVENOUS | Status: DC
  Filled 2014-08-15 (×4): qty 50

## 2014-08-15 MED ORDER — VANCOMYCIN HCL IN DEXTROSE 1-5 GM/200ML-% IV SOLN
1000.0000 mg | Freq: Once | INTRAVENOUS | Status: AC
  Administered 2014-08-15: 1000 mg via INTRAVENOUS

## 2014-08-15 MED ORDER — MOMETASONE FURO-FORMOTEROL FUM 200-5 MCG/ACT IN AERO
2.0000 | INHALATION_SPRAY | Freq: Two times a day (BID) | RESPIRATORY_TRACT | Status: DC
  Administered 2014-08-15 – 2014-08-16 (×2): 2 via RESPIRATORY_TRACT
  Filled 2014-08-15: qty 8.8

## 2014-08-15 MED ORDER — PIPERACILLIN-TAZOBACTAM 3.375 G IVPB 30 MIN: 3.3750 g | Freq: Three times a day (TID) | INTRAVENOUS | Status: DC

## 2014-08-15 MED ORDER — NOREPINEPHRINE 4 MG/250ML-% IV SOLN
0.0000 ug/min | INTRAVENOUS | Status: DC
  Administered 2014-08-15 – 2014-08-16 (×2): 5 ug/min via INTRAVENOUS
  Filled 2014-08-15 (×2): qty 250

## 2014-08-15 MED ORDER — PIPERACILLIN-TAZOBACTAM 3.375 G IVPB
3.3750 g | Freq: Two times a day (BID) | INTRAVENOUS | Status: DC
  Filled 2014-08-15 (×2): qty 50

## 2014-08-15 MED ORDER — SODIUM CHLORIDE 0.9 % IV SOLN
INTRAVENOUS | Status: DC
  Administered 2014-08-15 – 2014-08-16 (×2): via INTRAVENOUS

## 2014-08-15 MED ORDER — CETYLPYRIDINIUM CHLORIDE 0.05 % MT LIQD
7.0000 mL | Freq: Two times a day (BID) | OROMUCOSAL | Status: DC
  Administered 2014-08-16 – 2014-08-17 (×3): 7 mL via OROMUCOSAL

## 2014-08-15 NOTE — ED Notes (Signed)
PT to ER via EMS from [facility] in [place].  EMS called out for resp distress.  Pt is altered, hypotensive and decreased O2 Sats.  Pt has out of facility DNR.  Dr. Mayford Knife to bedside to assess pt, will discuss wishes with family.

## 2014-08-15 NOTE — Consult Note (Addendum)
Palliative Medicine Inpatient Consult Note   Name: Denise Duke Date: 08/15/2014 MRN: [id number]  DOB: 22-Dec-1927  Referring Physician: Emily Filbert, MD  Palliative Care consult requested for this 81 y.o. female for goals of medical therapy in patient with 02-dependent COPD/pulmonary fibrosis admitted with acute on chronic respiratory failure   Denise Duke is an 81 yo woman with PMH of O2-dependent COPD/pulmonary fibrosis, diastolic dysfunction with h/o CHF, RA, h/o chest pain, GERD, IBS, deconditioning, HTN, hyperlipidemia, anxiety, TIA, s/p hysterectomy, cholecystectomy, L. knee sgy, hernia repair, B. foot sgy. She was just hospitalized 5/23/-08/07/14 with acute on chronic respiratory failure. She is now readmitted 08/15/14 with same. At present, pt is lying on stretcher in the ER, minimally responsive. Sister at bedside. This is pt's 6th hospitalization in the past 4 months.    REVIEW OF SYSTEMS:  Patient is not able to provide ROS  SOCIAL HISTORY:Pt is widowed. She lives at [facility] where she is followed by hospice. Pt has a son. She has a sister in [place] who is involved in her care. Her HCPOA is friend, Denise Duke [phone]. Pt used to work in [employer], fashion/clothing.  reports that she has never smoked. Her smokeless tobacco use includes Snuff. She reports that she does not drink alcohol or use illicit drugs.  LEGAL DOCUMENTS:  Advance Directives:  Yes. Health Care Power of Attorney:  Yes.  CODE STATUS: DNR  PAST MEDICAL HISTORY: Past Medical History  Diagnosis Date  . CHF (congestive heart failure)   . TIA (transient ischemic attack)   . Anxiety   . COPD (chronic obstructive pulmonary disease)   . Hypertension   . Pulmonary fibrosis   . Hearing loss   . Arthritis   . GERD (gastroesophageal reflux disease)     PAST SURGICAL HISTORY:  Past Surgical History  Procedure Laterality Date  . Cholecystectomy    . Hernia repair    . Abdominal hysterectomy       ALLERGIES:  has no active allergies.  MEDICATIONS:  Current Facility-Administered Medications  Medication Dose Route Frequency Provider Last Rate Last Dose  . piperacillin-tazobactam (ZOSYN) IVPB 3.375 g  3.375 g Intravenous Once Emily Filbert, MD 100 mL/hr at 08/15/14 1320 3.375 g at 08/15/14 1320  . vancomycin (VANCOCIN) IVPB 1000 mg/200 mL premix  1,000 mg Intravenous Once Emily Filbert, MD       Current Outpatient Prescriptions  Medication Sig Dispense Refill  . Calcium Carbonate-Vitamin D 600-400 MG-UNIT per tablet Take 1 tablet by mouth 2 (two) times daily.    . carvedilol (COREG) 6.25 MG tablet Take 1 tablet (6.25 mg total) by mouth 2 (two) times daily with a meal. 60 tablet 1  . clopidogrel (PLAVIX) 75 MG tablet Take 75 mg by mouth daily.    . Fluticasone-Salmeterol (ADVAIR) 500-50 MCG/DOSE AEPB Inhale 1 puff into the lungs 2 (two) times daily.    . isosorbide mononitrate (IMDUR) 30 MG 24 hr tablet Take 30 mg by mouth daily.    Marland Kitchen leflunomide (ARAVA) 20 MG tablet Take 10 mg by mouth every morning. Take half tab of 20mg     . amoxicillin-clavulanate (AUGMENTIN) 500-125 MG per tablet Take 1 tablet (500 mg total) by mouth 3 (three) times daily. (Patient not taking: Reported on 08/15/2014) 20 tablet 0  . clonazePAM (KLONOPIN) 0.5 MG tablet Take 1 tablet (0.5 mg total) by mouth 3 (three) times daily as needed (nervousness). (Patient not taking: Reported on 08/15/2014) 30 tablet 0  . clonazePAM (  KLONOPIN) 0.5 MG tablet Take 1 tablet (0.5 mg total) by mouth 3 (three) times daily as needed (nervousness). (Patient not taking: Reported on 08/15/2014) 90 tablet 0  . doxycycline (VIBRA-TABS) 100 MG tablet Take 1 tablet (100 mg total) by mouth 2 (two) times daily. (Patient not taking: Reported on 08/15/2014) 20 tablet 0  . furosemide (LASIX) 20 MG tablet Take 1 tablet (20 mg total) by mouth daily. (Patient not taking: Reported on 08/15/2014) 30 tablet 6  . nystatin cream (MYCOSTATIN) Apply 1  application topically 2 (two) times daily. (Patient not taking: Reported on 08/15/2014) 30 g 0  . predniSONE (DELTASONE) 50 MG tablet Take one tablet daily (Patient not taking: Reported on 08/15/2014) 5 tablet 0  . traMADol (ULTRAM) 50 MG tablet Take 1 tablet (50 mg total) by mouth 2 (two) times daily as needed for moderate pain (pain). (Patient not taking: Reported on 08/15/2014) 30 tablet 0    Vital Signs: BP 70/60 mmHg  Pulse 93  Temp(Src) 103.1 F (39.5 C) (Rectal)  Resp 28  Ht 4\' 9"  (1.448 m)  Wt 78.019 kg (172 lb)  BMI 37.21 kg/m2  SpO2 97% Filed Weights   08/15/14 1214  Weight: 78.019 kg (172 lb)    Estimated body mass index is 37.21 kg/(m^2) as calculated from the following:   Height as of this encounter: 4\' 9"  (1.448 m).   Weight as of this encounter: 78.019 kg (172 lb).  PERFORMANCE STATUS (ECOG) : 3 - Symptomatic, >50% confined to bed  PHYSICAL EXAM:  General: critically ill-appearing HEENT: OP clear, moist oral mucosa, poor dentition Neck: Trachea midline  Cardiovascular: regular rhythm, tachycardic Pulmonary/Chest: poor air movement, + wheeze Abdominal: Soft, NTTP, hypoactive bowel sounds GU: No SP tenderness Extremities: + edema  Neurological: opens eyes to voice, mumbles a few words, moves extremities Skin: no rashes Psychiatric: lethargic  LABS: CBC:  Recent Labs Lab 08/15/14 1238  WBC 8.5  HGB 11.8*  HCT 36.9  PLT 166   Comprehensive Metabolic Panel:  Recent Labs Lab 08/15/14 1238  NA 138  K 4.3  CL 94*  CO2 32  GLUCOSE 91  BUN 33*  CREATININE 1.83*  CALCIUM 7.4*  AST 38  ALT 27  ALKPHOS 96  BILITOT 0.9    IMPRESSION: Denise Duke is an 81 yo woman with PMH of O2-dependent COPD/pulmonary fibrosis, diastolic dysfunction with h/o CHF, RA, h/o chest pain, GERD, IBS, deconditioning, HTN, hyperlipidemia, anxiety, TIA, s/p hysterectomy, cholecystectomy, L. knee sgy, hernia repair, B. foot sgy. She was just hospitalized 5/23/-08/07/14 with acute on  chronic respiratory failure. She is now readmitted 08/15/14 with same. At present, pt is lying on stretcher in the ER, minimally responsive. Sister at bedside. This is pt's 6th hospitalization in the past 4 months.   Pt known to me from previous hospitalization. At that time, d/c to a higher level of care was discussed but ultimately pt returned to ALF with hospice following. I spoke with pt's hospice RN who said that pt was doing well until today. Hospice was called when pt became ill and RN was en route to ALF but pt's sister insisted that pt be brought to ER before hospice RN arrived.   I spoke with pt's HCPOA, 08/09/14. She recognizes that pt is declining and feels that pt is "ready to go". However, Denise 10/15/14 is deferring end of life decisions to pt's family (sister, Denise Duke and granddaughter, Harlon Flor). I hope to meet with family (and pt if possible) to discuss plan  to avoid rehospitalization.   Pt is DNR. Out-of-facility DNR sent to ER with pt.   PLAN: As above  REFERRALS TO BE ORDERED:  Hospice   More than 50% of the visit was spent in counseling/coordination of care: YES  Time spent: 75 minutes

## 2014-08-15 NOTE — Progress Notes (Signed)
Visit made. Ms. Wedel is [facility] with a hospice dx of COPD, she is a DNR. Patient sent to the ED via EMS from [facility] today after her sister found her to be "pale and not moving". Hospice was contacted after EMS was called. Patient seen lying in bed, eyes closed, oxygen on at 4 liters via nasal cannula, breathing dyssynchronous, she did open her eyes to voice, stated she had come to the hospital about 8 am this morning and that she was feeling "some better". Writer spoke with staff RN Denny Peon who reports that patient had been just started on Levophed for continued low BP (70/60). Denny Peon also reported that Ms. Lindenberger's HCPOA Rosey Bath had been present and consented to a central line. Per chart review Palliative Medicine physician Dr.Phifer plans to talk with family(sister, Peggy and granddaughter,Toni and HCPOA, who is a friend, tomorrow to discuss plan to avoid further hospitalizations. Will continue to follow. Dayna Barker RN, BSN, [facility] [place], [facility] [phone] c

## 2014-08-15 NOTE — H&P (Addendum)
[facility] [place] at [facility]   PATIENT NAME: Denise Duke    MR#:  [id number]  DATE OF BIRTH:  12-Feb-1928  DATE OF ADMISSION:  08/15/2014  PRIMARY CARE PHYSICIAN: Rolm Gala, MD   REQUESTING/REFERRING PHYSICIAN: Daryel November MD  CHIEF COMPLAINT:   Chief Complaint  Patient presents with  . Respiratory Distress    HISTORY OF PRESENT ILLNESS:  Denise Duke  is a 81 y.o. female with a known history of CHF, COPD, hypertension and TIA presents to the emergency room with hypotension, weakness, fever, altered mental status. Unknown how long this been going on.  Apparently She was just hospitalized 5/23/-08/07/14 with acute on chronic respiratory failure. She is now being readmitted with same. At present, pt is lying on stretcher in the ER, minimally responsive. This is pt's 6th hospitalization in the past 4 months. Patient was recently in the hospital for septic shock and urinary tract infection. Her blood pressure was found to be low at 68 / 33 in triage and is being admitted for septic shock. At Baseline she wears 3-4 L oxygen due to her pulmonary fibrosis.   PAST MEDICAL HISTORY:   Past Medical History  Diagnosis Date  . CHF (congestive heart failure)   . TIA (transient ischemic attack)   . Anxiety   . COPD (chronic obstructive pulmonary disease)   . Hypertension   . Pulmonary fibrosis   . Hearing loss   . Arthritis   . GERD (gastroesophageal reflux disease)     PAST SURGICAL HISTORY:   Past Surgical History  Procedure Laterality Date  . Cholecystectomy    . Hernia repair    . Abdominal hysterectomy      SOCIAL HISTORY:   History  Substance Use Topics  . Smoking status: Never Smoker   . Smokeless tobacco: Current User    Types: Snuff  . Alcohol Use: No    FAMILY HISTORY:   Family History  Problem Relation Age of Onset  . Hypertension Mother     DRUG ALLERGIES:   No Active Allergies  REVIEW OF SYSTEMS:   Review of  Systems  Constitutional: Positive for fever and malaise/fatigue. Negative for weight loss and diaphoresis.  HENT: Negative for ear discharge, ear pain, hearing loss, nosebleeds, sore throat and tinnitus.   Eyes: Negative for blurred vision and pain.  Respiratory: Positive for shortness of breath. Negative for cough, hemoptysis and wheezing.   Cardiovascular: Negative for chest pain, palpitations, orthopnea and leg swelling.  Gastrointestinal: Negative for heartburn, nausea, vomiting, abdominal pain, diarrhea, constipation and blood in stool.  Genitourinary: Negative for dysuria, urgency and frequency.  Musculoskeletal: Negative for myalgias and back pain.  Skin: Negative for itching and rash.  Neurological: Positive for weakness. Negative for dizziness, tingling, tremors, focal weakness, seizures and headaches.       Altered mental status  Psychiatric/Behavioral: Negative for depression. The patient is not nervous/anxious.     MEDICATIONS AT HOME:   Prior to Admission medications   Medication Sig Start Date End Date Taking? Authorizing Provider  acetaminophen (TYLENOL) 500 MG tablet Take 500 mg by mouth 2 (two) times daily as needed (pain).   Yes Historical Provider, MD  albuterol (PROVENTIL) (2.5 MG/3ML) 0.083% nebulizer solution Take 3 mLs by nebulization every 6 (six) hours as needed for wheezing.   Yes Historical Provider, MD  albuterol-ipratropium (COMBIVENT) 18-103 MCG/ACT inhaler Inhale into the lungs every 4 (four) hours.   Yes Historical Provider, MD  alendronate (FOSAMAX) 70 MG  tablet Take 70 mg by mouth once a week. On Tuesdays.Take with a full glass of water on an empty stomach. Do not lie down for 30 minutes after dose.   Yes Historical Provider, MD  atorvastatin (LIPITOR) 80 MG tablet Take 80 mg by mouth at bedtime.   Yes Historical Provider, MD  Calcium Carbonate-Vitamin D 600-400 MG-UNIT per tablet Take 1 tablet by mouth 2 (two) times daily.   Yes Historical Provider, MD   capsaicin (ZOSTRIX) 0.025 % cream Apply topically 2 (two) times daily. Apply topically to affected area 2 times a day, as needed for pain   Yes Historical Provider, MD  carvedilol (COREG) 6.25 MG tablet Take 1 tablet (6.25 mg total) by mouth 2 (two) times daily with a meal. 07/28/14  Yes Katharina Caper, MD  celecoxib (CELEBREX) 200 MG capsule Take 200 mg by mouth daily as needed (as needed for pain).   Yes Historical Provider, MD  cephALEXin (KEFLEX) 500 MG capsule Take 1 capsule (500 mg total) by mouth 3 (three) times daily. 07/28/14  Yes Katharina Caper, MD  clonazePAM (KLONOPIN) 0.5 MG tablet Take 1 tablet (0.5 mg total) by mouth 3 (three) times daily as needed (nervousness). 07/28/14  Yes Katharina Caper, MD  clopidogrel (PLAVIX) 75 MG tablet Take 75 mg by mouth daily.   Yes Historical Provider, MD  CRANBERRY EXTRACT PO Take 425 mg by mouth daily. Cranberry tablet   Yes Historical Provider, MD  escitalopram (LEXAPRO) 10 MG tablet Take 10 mg by mouth daily.   Yes Historical Provider, MD  Fluticasone-Salmeterol (ADVAIR) 500-50 MCG/DOSE AEPB Inhale 1 puff into the lungs 2 (two) times daily.   Yes Historical Provider, MD  furosemide (LASIX) 20 MG tablet Take 800 mg by mouth daily.    Yes Historical Provider, MD  gabapentin (NEURONTIN) 600 MG tablet Take 600 mg by mouth 3 (three) times daily.   Yes Historical Provider, MD  isosorbide mononitrate (IMDUR) 30 MG 24 hr tablet Take 30 mg by mouth daily.   Yes Historical Provider, MD  leflunomide (ARAVA) 20 MG tablet Take 10 mg by mouth every morning. Take half tab of    Yes Historical Provider, MD  lisinopril (PRINIVIL,ZESTRIL) 2.5 MG tablet Take 1.25 mg by mouth daily. Take half tab of 2.5mg    Yes Historical Provider, MD  loperamide (IMODIUM) 2 MG capsule Take by mouth 4 (four) times daily as needed for diarrhea or loose stools.   Yes Historical Provider, MD  methylPREDNISolone (MEDROL DOSEPAK) 4 MG TBPK tablet follow package directions 07/28/14  Yes Katharina Caper, MD  montelukast (SINGULAIR) 10 MG tablet Take 10 mg by mouth at bedtime.   Yes Historical Provider, MD  pantoprazole (PROTONIX) 40 MG tablet Take 40 mg by mouth every morning.   Yes Historical Provider, MD  potassium chloride (K-DUR) 10 MEQ tablet Take 10 mEq by mouth daily.   Yes Historical Provider, MD  pregabalin (LYRICA) 25 MG capsule Take 25 mg by mouth 3 (three) times daily.   Yes Historical Provider, MD  Probiotic Product (PROBIOTIC FORMULA PO) Take by mouth every morning. Probiotic Formula-oral cap   Yes Historical Provider, MD  traMADol (ULTRAM) 50 MG tablet Take 1 tablet (50 mg total) by mouth 2 (two) times daily as needed for moderate pain (pain). 07/28/14  Yes Katharina Caper, MD     VITAL SIGNS:  Blood pressure 77/51, pulse 85, temperature 103.1 F (39.5 C), temperature source Rectal, resp. rate 20, height  (1.448 m), weight 78.019 kg (172  lb), SpO2 97 %.  PHYSICAL EXAMINATION:  Physical Exam  GENERAL:  81 y.o.-year-old patient lying in the bed - critically ill appearing Obese. EYES: Pupils equal, round, reactive to light and accommodation. No scleral icterus. Extraocular muscles intact. Decreased hearing. HEENT: Head atraumatic, normocephalic. Oropharynx and nasopharynx clear. No oropharyngeal erythema, moist oral mucosa  NECK:  Supple, no jugular venous distention. No thyroid enlargement, no tenderness.  LUNGS: Bilateral crackles and weezing.. No use of accessory muscles of respiration.  CARDIOVASCULAR: S1, S2 normal. No murmurs, rubs, or gallops.  ABDOMEN: Soft, nontender, nondistended. Bowel sounds present. No organomegaly or mass.  EXTREMITIES: No pedal edema, cyanosis, or clubbing. + 2 pedal & radial pulses b/l.   NEUROLOGIC: Cranial nerves II through XII are intact. No focal Motor or sensory deficits appreciated b/l. mumbles a few words, moves extremities PSYCHIATRIC: The patient is lethargic Good affect.  SKIN: No obvious rash, lesion, or ulcer.    LABORATORY PANEL:   CBC  Recent Labs Lab 08/15/14 1238  WBC 8.5  HGB 11.8*  HCT 36.9  PLT 166   ------------------------------------------------------------------------------------------------------------------  Chemistries   Recent Labs Lab 08/15/14 1238  NA 138  K 4.3  CL 94*  CO2 32  GLUCOSE 91  BUN 33*  CREATININE 1.83*  CALCIUM 7.4*  AST 38  ALT 27  ALKPHOS 96  BILITOT 0.9   ------------------------------------------------------------------------------------------------------------------  Cardiac Enzymes  Recent Labs Lab 08/15/14 1238  TROPONINI 0.15*   ------------------------------------------------------------------------------------------------------------------  RADIOLOGY:  Dg Chest 1 View  08/15/2014   CLINICAL DATA:  Respiratory distress  EXAM: CHEST  1 VIEW  COMPARISON:  08/05/2014  FINDINGS: Cardiac shadow is enlarged. Stable interstitial changes consistent with pulmonary fibrosis are noted bilaterally. No definitive acute infiltrate or sizable effusion is seen. No bony abnormality is noted.  IMPRESSION: Chronic fibrotic changes without acute abnormality.   Electronically Signed   By: Alcide Clever M.D.   On: 08/15/2014 13:27    IMPRESSION AND PLAN:   1.  Septic shock: Had fever of 103.1, blood pressure 68/33 and heart rate as high as up to 108.  Lactic acid is 2.1.  No clear source of infection found yet. We will start broad-spectrum antibiotics for possible hospital acquired infection, although her chest x-ray is not showing any acute changes.  This certainly could be possible pneumonia as she is at high risk for aspiration.  Send for blood and sputum cultures. Oxygen requirement at baseline and is needing 3-4 L oxygen.  Further management as per culture results and clinical progress.  Aggressive IV hydration and pressors as need, central line will be obtained in CCU.  2. Elevated troponin. Patient has had chronic elevation of troponin around 0.1.  Presently this is increased to 0.15. Likely from underlying sepsis. No chest pain. Check more sets of troponin.  Likely supply demand ischemia.  3. Diastolic chf Stop lasix due to hypotension. Watch for fluid overload.  4. Acute renal failure: We will hydrate with IV fluid and monitor her renal function, likely prerenal in nature  5. Hypotension: Stop BP meds due to hypotension.    All the records are reviewed and case discussed with ED provider. Management plans discussed with the patient, family and they are in agreement.  CODE STATUS: DO NOT RESUSCITATE  Discussed with palliative care and appreciate their time who has been talking with family and family wants to keep everything going at this point until other family members including patient's sister makes end-of-life decisions.  TOTAL TIME TAKING CARE OF  THIS PATIENT(critical): 45 minutes.    [facility], Meklit Cotta M.D on 08/15/2014 at 4:29 PM  Between 7am to 6pm - Pager - [phone]  After 6pm go to [email or web address] - password [email or web address] [facility]  [facility]  Office  [phone]  CC: Primary care physician; Rolm Gala, MD

## 2014-08-15 NOTE — Progress Notes (Addendum)
Patient admitted to ICU in evening. Arousable to voice, reporting no pain. BP low, levophed started per order parameters and [facility] placed central line/PICC. Patient currently on 4L n/c, which is baseline per family. Incontinent BM since admission. Critical lactic acid result called to [employer] Resting quietly between care. Will continue to monitor.

## 2014-08-15 NOTE — ED Provider Notes (Signed)
[facility] Emergency Department Provider Note     Time seen: ----------------------------------------- 12:14 PM on 08/15/2014 -----------------------------------------  Level V caveat, patient altered cannot give review of systems or history.  I have reviewed the triage vital signs and the nursing notes.   HISTORY  Chief Complaint Respiratory Distress    HPI Denise Duke is a 81 y.o. female who presents to ER forhypotension, weakness, fever, altered mental status. Unknown how long this been going on. Poorly she's been admitted 4 times last month for sepsis and pneumonia. Patient is short of breath on arrival, is found to be hypoxic, tachycardic, hypertensive, altered.    Past Medical History  Diagnosis Date  . CHF (congestive heart failure)   . TIA (transient ischemic attack)   . Anxiety   . COPD (chronic obstructive pulmonary disease)   . Hypertension   . Pulmonary fibrosis   . Hearing loss   . Arthritis   . GERD (gastroesophageal reflux disease)     Patient Active Problem List   Diagnosis Date Noted  . HCAP (healthcare-associated pneumonia) 08/07/2014  . Acute-on-chronic respiratory failure 08/07/2014  . Acute respiratory distress 07/28/2014  . Atrial fibrillation 07/28/2014  . COPD exacerbation 07/28/2014  . UTI (lower urinary tract infection) 07/23/2014  . Sepsis 07/23/2014  . ARF (acute renal failure) 07/23/2014  . Septic shock 07/23/2014    Past Surgical History  Procedure Laterality Date  . Cholecystectomy    . Hernia repair    . Abdominal hysterectomy      Current Outpatient Rx  Name  Route  Sig  Dispense  Refill  . acetaminophen (TYLENOL) 500 MG tablet   Oral   Take 500 mg by mouth 2 (two) times daily as needed (pain).         Marland Kitchen albuterol (PROVENTIL) (2.5 MG/3ML) 0.083% nebulizer solution   Nebulization   Take 3 mLs by nebulization every 6 (six) hours as needed for wheezing.         Marland Kitchen albuterol-ipratropium  (COMBIVENT) 18-103 MCG/ACT inhaler   Inhalation   Inhale into the lungs every 4 (four) hours.         Marland Kitchen alendronate (FOSAMAX) 70 MG tablet   Oral   Take 70 mg by mouth once a week. On Tuesdays.Take with a full glass of water on an empty stomach. Do not lie down for 30 minutes after dose.         Marland Kitchen amoxicillin-clavulanate (AUGMENTIN) 500-125 MG per tablet   Oral   Take 1 tablet (500 mg total) by mouth 3 (three) times daily.   20 tablet   0   . atorvastatin (LIPITOR) 80 MG tablet   Oral   Take 80 mg by mouth at bedtime.         . Calcium Carbonate-Vitamin D 600-400 MG-UNIT per tablet   Oral   Take 1 tablet by mouth 2 (two) times daily.         . capsaicin (ZOSTRIX) 0.025 % cream   Topical   Apply topically 2 (two) times daily. Apply topically to affected area 2 times a day, as needed for pain         . carvedilol (COREG) 6.25 MG tablet   Oral   Take 1 tablet (6.25 mg total) by mouth 2 (two) times daily with a meal.   60 tablet   1   . celecoxib (CELEBREX) 200 MG capsule   Oral   Take 200 mg by mouth daily as needed (as  needed for pain).         . clonazePAM (KLONOPIN) 0.5 MG tablet   Oral   Take 1 tablet (0.5 mg total) by mouth 3 (three) times daily as needed (nervousness).   30 tablet   0   . clonazePAM (KLONOPIN) 0.5 MG tablet   Oral   Take 1 tablet (0.5 mg total) by mouth 3 (three) times daily as needed (nervousness).   90 tablet   0   . clopidogrel (PLAVIX) 75 MG tablet   Oral   Take 75 mg by mouth daily.         Marland Kitchen CRANBERRY EXTRACT PO   Oral   Take 425 mg by mouth daily. Cranberry tablet         . doxycycline (VIBRA-TABS) 100 MG tablet   Oral   Take 1 tablet (100 mg total) by mouth 2 (two) times daily.   20 tablet   0   . escitalopram (LEXAPRO) 10 MG tablet   Oral   Take 10 mg by mouth daily.         . Fluticasone-Salmeterol (ADVAIR) 500-50 MCG/DOSE AEPB   Inhalation   Inhale 1 puff into the lungs 2 (two) times daily.          . furosemide (LASIX) 20 MG tablet   Oral   Take 1 tablet (20 mg total) by mouth daily.   30 tablet   6   . gabapentin (NEURONTIN) 600 MG tablet   Oral   Take 600 mg by mouth 3 (three) times daily.         . isosorbide mononitrate (IMDUR) 30 MG 24 hr tablet   Oral   Take 30 mg by mouth daily.         Marland Kitchen leflunomide (ARAVA) 20 MG tablet   Oral   Take 10 mg by mouth every morning. Take half tab of 20mg          . lisinopril (PRINIVIL,ZESTRIL) 2.5 MG tablet   Oral   Take 1.25 mg by mouth daily. Take half tab of 2.5mg          . loperamide (IMODIUM) 2 MG capsule   Oral   Take by mouth 4 (four) times daily as needed for diarrhea or loose stools.         . montelukast (SINGULAIR) 10 MG tablet   Oral   Take 10 mg by mouth at bedtime.         Marland Kitchen nystatin cream (MYCOSTATIN)   Topical   Apply 1 application topically 2 (two) times daily.   30 g   0   . pantoprazole (PROTONIX) 40 MG tablet   Oral   Take 40 mg by mouth every morning.         . potassium chloride (K-DUR) 10 MEQ tablet   Oral   Take 10 mEq by mouth daily.         . predniSONE (DELTASONE) 50 MG tablet      Take one tablet daily   5 tablet   0   . pregabalin (LYRICA) 25 MG capsule   Oral   Take 25 mg by mouth 3 (three) times daily.         . Probiotic Product (PROBIOTIC FORMULA PO)   Oral   Take by mouth every morning. Probiotic Formula-oral cap         . traMADol (ULTRAM) 50 MG tablet   Oral   Take 1 tablet (50 mg total) by mouth 2 (  two) times daily as needed for moderate pain (pain).   30 tablet   0     Allergies Aspirin; Levaquin; and Sulfa antibiotics  Family History  Problem Relation Age of Onset  . Hypertension Mother     Social History History  Substance Use Topics  . Smoking status: Never Smoker   . Smokeless tobacco: Current User    Types: Snuff  . Alcohol Use: No    Review of Systems Constitutional: Fever Eyes: Negative for visual changes. ENT: Negative  for sore throat. Cardiovascular: Negative for chest pain. Respiratory: As of breath and cough. Gastrointestinal: Negative for abdominal pain, vomiting and diarrhea. Genitourinary: Negative for dysuria. Musculoskeletal: Negative for back pain. Skin: Negative for rash. Neurological: Positive for weakness and altered mental status  10-point ROS otherwise negative.  ____________________________________________   PHYSICAL EXAM:  VITAL SIGNS: ED Triage Vitals  Enc Vitals Group     BP --      Pulse --      Resp --      Temp --      Temp src --      SpO2 --      Weight --      Height --      Head Cir --      Peak Flow --      Pain Score --      Pain Loc --      Pain Edu? --      Excl. in GC? --     Constitutional: Drowsy, lethargic, intermittently answers questions. Eyes: Conjunctivae are normal. PERRL. Normal extraocular movements. ENT   Head: Normocephalic and atraumatic.   Nose: No congestion/rhinnorhea.   Mouth/Throat: Mucous membranes are moist.   Neck: No stridor. Hematological/Lymphatic/Immunilogical: No cervical lymphadenopathy. Cardiovascular: Rapid rate, regular rhythm Normal and symmetric decreased distal pulses Respiratory: Tachypnea, rhonchi is increased on left.  Gastrointestinal: Soft and nontender. No distention. No abdominal bruits. There is no CVA tenderness. Musculoskeletal: Nontender with normal range of motion in all extremities. No joint effusions.  No lower extremity tenderness nor edema. Neurologic:  Patient is slurred speech, delayed response to commands. Skin:  Skin is warm, dry and intact. No rash noted. Psychiatric: Unable to assess  ____________________________________________    LABS (pertinent positives/negatives)  Labs Reviewed  CBC WITH DIFFERENTIAL/PLATELET - Abnormal; Notable for the following:    Hemoglobin 11.8 (*)    RDW 17.4 (*)    Neutro Abs 6.9 (*)    Lymphs Abs 0.9 (*)    All other components within normal  limits  COMPREHENSIVE METABOLIC PANEL - Abnormal; Notable for the following:    Chloride 94 (*)    BUN 33 (*)    Creatinine, Ser 1.83 (*)    Calcium 7.4 (*)    Albumin 3.0 (*)    GFR calc non Af Amer 24 (*)    GFR calc Af Amer 28 (*)    All other components within normal limits  TROPONIN I - Abnormal; Notable for the following:    Troponin I 0.15 (*)    All other components within normal limits  URINALYSIS COMPLETEWITH MICROSCOPIC ([facility] ONLY) - Abnormal; Notable for the following:    Color, Urine YELLOW (*)    APPearance CLEAR (*)    Hgb urine dipstick 1+ (*)    All other components within normal limits  LACTIC ACID, PLASMA - Abnormal; Notable for the following:    Lactic Acid, Venous 2.1 (*)    All other components within  normal limits  BRAIN NATRIURETIC PEPTIDE - Abnormal; Notable for the following:    B Natriuretic Peptide 189.0 (*)    All other components within normal limits  CULTURE, BLOOD (ROUTINE X 2)  CULTURE, BLOOD (ROUTINE X 2)  URINE CULTURE  LACTIC ACID, PLASMA    EKG: Interpreted by me. Sinus tachycardia with a rate of 120, left anterior fascicular block, no evidence of hypertrophy or acute infarction.  ____________________________________________  ED COURSE:  Pertinent labs & imaging results that were available during my care of the patient were reviewed by me and considered in my medical decision making (see chart for details). Patient is septic, discussed with the family. Advised of poor prognosis. They want fluids and antibiotics, do not want aggressive measures including intubation or CPR. She is DO NOT RESUSCITATE, expect poor outcome.  ____________________________________________   RADIOLOGY  chest x-ray  FINDINGS: Cardiac shadow is enlarged. Stable interstitial changes consistent with pulmonary fibrosis are noted bilaterally. No definitive acute infiltrate or sizable effusion is seen. No bony abnormality is noted.  IMPRESSION: Chronic  fibrotic changes without acute abnormality.____________________________________________  CRITICAL CARE Performed by: Emily Filbert   Total critical care time: 30 minutes  Critical care time was exclusive of separately billable procedures and treating other patients.  Critical care was necessary to treat or prevent imminent or life-threatening deterioration.  Critical care was time spent personally by me on the following activities: development of treatment plan with patient and/or surrogate as well as nursing, discussions with consultants, evaluation of patient's response to treatment, examination of patient, obtaining history from patient or surrogate, ordering and performing treatments and interventions, ordering and review of laboratory studies, ordering and review of radiographic studies, pulse oximetry and re-evaluation of patient's condition.   FINAL ASSESSMENT AND PLAN  Sepsis  Plan: We'll consult palliative care, as this is her fifth visit in last month, patient was received IV Vancocin Zosyn per sepsis protocol. She is receiving 3 L normal saline IV fluid bolus, patient DO NOT RESUSCITATE does not want overly aggressive care. We'll not intubate or place her on pressors. Currently blood pressure is low, heart rate is improved some, she is not hypoxic currently on oxygen. No obvious pneumonia    Emily Filbert, MD   Emily Filbert, MD 08/15/14 [phone]

## 2014-08-15 NOTE — Progress Notes (Signed)
ANTIBIOTIC CONSULT NOTE - INITIAL  Pharmacy Consult for Vancomycin/Zosyn Indication: rule out sepsis  No Active Allergies  Patient Measurements: Height: 4\' 9"  (144.8 cm) Weight: 172 lb (78.019 kg) IBW/kg (Calculated) : 38.6 Adjusted Body Weight: 58.5 kg  Vital Signs: Temp: 103.1 F (39.5 C) (06/02 1317) Temp Source: Rectal (06/02 1317) BP: 101/59 mmHg (06/02 1503) Pulse Rate: 104 (06/02 1503) Intake/Output from previous day:   Intake/Output from this shift:    Labs:  Recent Labs  08/15/14 1238  WBC 8.5  HGB 11.8*  PLT 166  CREATININE 1.83*   Estimated Creatinine Clearance: 19.3 mL/min (by C-G formula based on Cr of 1.83). No results for input(s): VANCOTROUGH, VANCOPEAK, VANCORANDOM, GENTTROUGH, GENTPEAK, GENTRANDOM, TOBRATROUGH, TOBRAPEAK, TOBRARND, AMIKACINPEAK, AMIKACINTROU, AMIKACIN in the last 72 hours.   Kinetics:   Ke: 0.022, VD: 40.95  Microbiology: Recent Results (from the past 720 hour(s))  Blood Culture (routine x 2)     Status: None   Collection Time: 07/23/14 11:25 AM  Result Value Ref Range Status   Specimen Description BLOOD  Final   Special Requests BLOOD  Final   Culture NO GROWTH 5 DAYS  Final   Report Status 07/28/2014 FINAL  Final  Blood Culture (routine x 2)     Status: None   Collection Time: 07/23/14 11:45 AM  Result Value Ref Range Status   Specimen Description BLOOD  Final   Special Requests BLOOD  Final   Culture NO GROWTH 5 DAYS  Final   Report Status 07/28/2014 FINAL  Final  Urine culture     Status: None   Collection Time: 07/23/14 12:15 PM  Result Value Ref Range Status   Specimen Description URINE, RANDOM  Final   Special Requests URINE, RANDOM  Final   Culture   Final    >=100,000 COLONIES/mL PROTEUS MIRABILIS 30,000 COLONIES/ml KLEBSIELLA PNEUMONIAE    Report Status 07/27/2014 FINAL  Final   Organism ID, Bacteria KLEBSIELLA PNEUMONIAE  Final   Organism ID, Bacteria PROTEUS MIRABILIS  Final      Susceptibility   Klebsiella pneumoniae - MIC*    AMPICILLIN 8 RESISTANT Resistant     CEFTAZIDIME <=1 SENSITIVE Sensitive     CEFAZOLIN <=4 SENSITIVE Sensitive     CEFTRIAXONE <=1 SENSITIVE Sensitive     CIPROFLOXACIN <=0.25 SENSITIVE Sensitive     GENTAMICIN <=1 SENSITIVE Sensitive     IMIPENEM <=0.25 SENSITIVE Sensitive     TRIMETH/SULFA <=20 SENSITIVE Sensitive     CEFOXITIN <=4 SENSITIVE Sensitive     * 30,000 COLONIES/ml KLEBSIELLA PNEUMONIAE   Proteus mirabilis - MIC*    AMPICILLIN <=2 SENSITIVE Sensitive     CEFTAZIDIME <=1 SENSITIVE Sensitive     CEFAZOLIN <=4 SENSITIVE Sensitive     CEFTRIAXONE <=1 SENSITIVE Sensitive     CIPROFLOXACIN 2 INTERMEDIATE Intermediate     GENTAMICIN <=1 SENSITIVE Sensitive     IMIPENEM 2 SENSITIVE Sensitive     TRIMETH/SULFA <=20 SENSITIVE Sensitive     CEFOXITIN <=4 SENSITIVE Sensitive     * >=100,000 COLONIES/mL PROTEUS MIRABILIS  Influenza A&B Antigens [facility])     Status: None   Collection Time: 07/23/14 12:25 PM  Result Value Ref Range Status   Influenza A [facility]) NOT DETECTED  Final   Influenza B ([facility]) NOT DETECTED  Final  C difficile quick scan w PCR reflex [facility])     Status: None   Collection Time: 08/05/14 10:02 AM  Result Value Ref Range Status   C Diff antigen NEGATIVE  Final   C Diff toxin Negative for C. difficile  Final   C Diff interpretation NEGATIVE  Final  Urine culture     Status: None   Collection Time: 08/05/14 10:02 AM  Result Value Ref Range Status   Specimen Description URINE, CLEAN CATCH  Final   Special Requests NONE  Final   Culture 7,000 COLONIES/mL INSIGNIFICANT GROWTH  Final   Report Status 08/07/2014 FINAL  Final  Blood culture (routine x 2)     Status: None   Collection Time: 08/05/14 10:04 AM  Result Value Ref Range Status   Specimen Description BLOOD  Final   Special Requests Normal  Final   Culture NO GROWTH 5 DAYS  Final   Report Status 08/10/2014 FINAL  Final  Blood culture (routine x 2)     Status: None    Collection Time: 08/05/14 10:04 AM  Result Value Ref Range Status   Specimen Description BLOOD  Final   Special Requests Normal  Final   Culture NO GROWTH 5 DAYS  Final   Report Status 08/10/2014 FINAL  Final  Urine culture     Status: None   Collection Time: 08/06/14  6:01 PM  Result Value Ref Range Status   Specimen Description URINE, CLEAN CATCH  Final   Special Requests NONE  Final   Culture 4,000 COLONIES/mL INSIGNIFICANT GROWTH  Final   Report Status 08/08/2014 FINAL  Final    Medical History: Past Medical History  Diagnosis Date  . CHF (congestive heart failure)   . TIA (transient ischemic attack)   . Anxiety   . COPD (chronic obstructive pulmonary disease)   . Hypertension   . Pulmonary fibrosis   . Hearing loss   . Arthritis   . GERD (gastroesophageal reflux disease)     Medications:  Scheduled:  . vancomycin  750 mg Intravenous Q36H   Infusions:  . piperacillin-tazobactam (ZOSYN)  IV     PRN:  Assessment: Patient with multiple recent admissions for sepsis PNA admitted from ALF where she is followed by hospice with sepsis to begin empiric abx. Palliative care is consulted.   Goal of Therapy:  Vancomycin trough level 15-20 mcg/ml  Plan:  1. Zosyn 3.375 g iv once then 3.375 g EI q 12 h.   2. Vancomycin 1000 mg iv once then 750 mg iv q 36 h. Will need to follow renal function closely and order levels/change dosing as indicated.   Luisa Hart D 08/15/2014,3:20 PM

## 2014-08-16 LAB — BASIC METABOLIC PANEL
ANION GAP: 9 (ref 5–15)
BUN: 23 mg/dL — ABNORMAL HIGH (ref 6–20)
CHLORIDE: 97 mmol/L — AB (ref 101–111)
CO2: 30 mmol/L (ref 22–32)
Calcium: 6.5 mg/dL — ABNORMAL LOW (ref 8.9–10.3)
Creatinine, Ser: 1.54 mg/dL — ABNORMAL HIGH (ref 0.44–1.00)
GFR calc non Af Amer: 30 mL/min — ABNORMAL LOW (ref >60)
GFR, EST AFRICAN AMERICAN: 34 mL/min — AB (ref >60)
Glucose, Bld: 148 mg/dL — ABNORMAL HIGH (ref 65–99)
POTASSIUM: 3.6 mmol/L (ref 3.5–5.1)
Sodium: 136 mmol/L (ref 135–145)

## 2014-08-16 LAB — MRSA PCR SCREENING: MRSA by PCR: NEGATIVE

## 2014-08-16 LAB — EXPECTORATED SPUTUM ASSESSMENT W GRAM STAIN, RFLX TO RESP C: Special Requests: NORMAL

## 2014-08-16 LAB — PHOSPHORUS: PHOSPHORUS: 2.6 mg/dL (ref 2.5–4.6)

## 2014-08-16 LAB — MAGNESIUM: MAGNESIUM: 1.1 mg/dL — AB (ref 1.7–2.4)

## 2014-08-16 LAB — EXPECTORATED SPUTUM ASSESSMENT W REFEX TO RESP CULTURE

## 2014-08-16 MED ORDER — HALOPERIDOL LACTATE 5 MG/ML IJ SOLN: 0.5000 mg | INTRAMUSCULAR | Status: DC | PRN

## 2014-08-16 MED ORDER — PROCHLORPERAZINE MALEATE 10 MG PO TABS: 10.0000 mg | ORAL_TABLET | Freq: Four times a day (QID) | ORAL | Status: DC | PRN

## 2014-08-16 MED ORDER — ACETAMINOPHEN 325 MG PO TABS
650.0000 mg | ORAL_TABLET | ORAL | Status: DC | PRN
  Filled 2014-08-16: qty 2

## 2014-08-16 MED ORDER — LORAZEPAM 1 MG PO TABS
1.0000 mg | ORAL_TABLET | ORAL | Status: DC | PRN
Start: 2014-08-16 — End: 2014-08-17
  Administered 2014-08-16 – 2014-08-17 (×2): 1 mg via ORAL
  Filled 2014-08-16 (×2): qty 1

## 2014-08-16 MED ORDER — CALCIUM CARBONATE-VITAMIN D 500-200 MG-UNIT PO TABS
1.0000 | ORAL_TABLET | Freq: Two times a day (BID) | ORAL | Status: DC
  Administered 2014-08-16: 1 via ORAL
  Filled 2014-08-16: qty 1

## 2014-08-16 MED ORDER — FUROSEMIDE 20 MG PO TABS: 20.0000 mg | ORAL_TABLET | Freq: Every day | ORAL | Status: DC

## 2014-08-16 MED ORDER — LORAZEPAM 2 MG/ML IJ SOLN: 1.0000 mg | INTRAMUSCULAR | Status: DC | PRN

## 2014-08-16 MED ORDER — LEFLUNOMIDE 20 MG PO TABS: 10.0000 mg | ORAL_TABLET | Freq: Every morning | ORAL | Status: DC

## 2014-08-16 MED ORDER — HALOPERIDOL 0.5 MG PO TABS
0.5000 mg | ORAL_TABLET | ORAL | Status: DC | PRN
  Filled 2014-08-16: qty 1

## 2014-08-16 MED ORDER — HALOPERIDOL LACTATE 2 MG/ML PO CONC
0.5000 mg | ORAL | Status: DC | PRN
  Filled 2014-08-16: qty 0.3

## 2014-08-16 MED ORDER — LORAZEPAM 1 MG PO TABS: 1.0000 mg | ORAL_TABLET | ORAL | Status: AC | PRN

## 2014-08-16 MED ORDER — PROCHLORPERAZINE 25 MG RE SUPP
25.0000 mg | Freq: Two times a day (BID) | RECTAL | Status: DC | PRN
  Filled 2014-08-16: qty 1

## 2014-08-16 MED ORDER — LORAZEPAM 2 MG/ML PO CONC: 1.0000 mg | ORAL | Status: DC | PRN

## 2014-08-16 MED ORDER — ISOSORBIDE MONONITRATE ER 60 MG PO TB24
30.0000 mg | ORAL_TABLET | Freq: Every day | ORAL | Status: DC
  Administered 2014-08-16: 30 mg via ORAL
  Filled 2014-08-16: qty 1

## 2014-08-16 MED ORDER — MORPHINE SULFATE (CONCENTRATE) 10 MG/0.5ML PO SOLN
5.0000 mg | ORAL | Status: DC | PRN
  Administered 2014-08-16 – 2014-08-17 (×2): 5 mg via ORAL
  Filled 2014-08-16 (×2): qty 0.5

## 2014-08-16 MED ORDER — MORPHINE SULFATE 2 MG/ML IJ SOLN
1.0000 mg | INTRAMUSCULAR | Status: DC | PRN
  Administered 2014-08-16 – 2014-08-17 (×2): 1 mg via INTRAVENOUS
  Filled 2014-08-16 (×2): qty 1

## 2014-08-16 MED ORDER — SODIUM CHLORIDE 0.9 % IV SOLN: INTRAVENOUS | Status: DC

## 2014-08-16 MED ORDER — MORPHINE SULFATE (CONCENTRATE) 10 MG/0.5ML PO SOLN: 5.0000 mg | ORAL | Status: DC | PRN

## 2014-08-16 MED ORDER — PROCHLORPERAZINE EDISYLATE 5 MG/ML IJ SOLN
10.0000 mg | Freq: Four times a day (QID) | INTRAMUSCULAR | Status: DC | PRN
Start: 2014-08-16 — End: 2014-08-17

## 2014-08-16 MED ORDER — MORPHINE SULFATE 20 MG/5ML PO SOLN: 5.0000 mg | ORAL | Status: DC | PRN

## 2014-08-16 MED ORDER — SENNA 8.6 MG PO TABS: 1.0000 | ORAL_TABLET | Freq: Every evening | ORAL | Status: DC | PRN

## 2014-08-16 NOTE — Evaluation (Signed)
Physical Therapy Evaluation Patient Details Name: Denise Duke MRN: [id number] DOB: October 18, 1927 Today's Date: 08/16/2014   History of Present Illness  presented to ER secondary to hypotension, weakness, fever and AMS; admitted with acute/chronic respiratory failure and sepsis.  Clinical Impression  Patient globally weak and deconditioned; continues with persistent hypotension (requiring continued use of levophed for BP support).  Notably fatigued, requiring frequent stimulation from therapist to maintain alertness towards end of session.  Unable to complete OOB activities due to BP and HR response (fluctuating from 110-130 in sinus arrythmia); will continue to assess and establish goals as patient medically appropriate.   Would benefit from skilled PT to address above deficits and promote optimal return to PLOF; anticipate need for STR upon discharge.    Follow Up Recommendations Supervision/Assistance - 24 hour    Equipment Recommendations       Recommendations for Other Services Rehab consult     Precautions / Restrictions Precautions Precautions: Fall Restrictions Weight Bearing Restrictions: No      Mobility  Bed Mobility Overal bed mobility: Needs Assistance Bed Mobility: Rolling Rolling: Mod assist         General bed mobility comments: heavy use of bedrails  Transfers                 General transfer comment: deferred secondary to BP, HR  Ambulation/Gait             General Gait Details: deferred secondary to BP, HR  Stairs            Wheelchair Mobility    Modified Rankin (Stroke Patients Only)       Balance                                             Pertinent Vitals/Pain Pain Assessment: No/denies pain    Home Living Family/patient expects to be discharged to:: Assisted living               Home Equipment: Walker - 4 wheels      Prior Function Level of Independence: Needs assistance   Gait /  Transfers Assistance Needed: Mod indep for household mobility with RW; ambulates to/from dining hall for meals (approx 50')  ADL's / Homemaking Assistance Needed: Assist from staff for ADLs and self-care   Does endorse frequent falls in recent weeks/months     Hand Dominance        Extremity/Trunk Assessment   Upper Extremity Assessment: Generalized weakness (grossly 4/5 throughout)           Lower Extremity Assessment: Generalized weakness (grossly 4/5 throughout)         Communication   Communication: HOH  Cognition Arousal/Alertness: Awake/alert Behavior During Therapy: WFL for tasks assessed/performed Overall Cognitive Status: Within Functional Limits for tasks assessed                      General Comments      Exercises Other Exercises Other Exercises: Supine LE therex, 1x10, AROM: ankle pumps, SAQs, heel slides Other Exercises: Rolling bilat (mod assist) for hygiene, linen change after incontinent bladder (dep)  (10 minutes)      Assessment/Plan    PT Assessment Patient needs continued PT services  PT Diagnosis Difficulty walking;Generalized weakness   PT Problem List Decreased strength;Decreased activity tolerance;Decreased mobility  PT Treatment Interventions Gait training;Functional mobility training;Therapeutic  exercise;Therapeutic activities;Balance training   PT Goals (Current goals can be found in the Care Plan section) Acute Rehab PT Goals Patient Stated Goal: go back home PT Goal Formulation: With patient Time For Goal Achievement: 08/30/14 Potential to Achieve Goals: Good    Frequency Min 2X/week   Barriers to discharge        Co-evaluation               End of Session Equipment Utilized During Treatment: Oxygen Activity Tolerance: Patient limited by fatigue (intermittently closing eyes during evaluation) Patient left: in bed;with call bell/phone within reach Nurse Communication:  (vitals and response to activity)          Time: 5427-0623 PT Time Calculation (min) (ACUTE ONLY): 24 min   Charges:     PT Treatments $Therapeutic Exercise: 8-22 mins   PT G Codes:        Reita Shindler H. Manson Passey, PT, DPT 08/16/2014, 1:03 PM [phone]

## 2014-08-16 NOTE — Progress Notes (Signed)
Per MD, pt is for d/c to [facility] tomorrow. CSW will f/u with weekend RN for [facility] tomorrow. Per MD note, pt's granddtr, Sheralyn Boatman, is surrogate decision maker and pt's friend, Rosey Bath, is not the HCPOA. CSW will follow. Dellie Burns, MSW, LCSW

## 2014-08-16 NOTE — Progress Notes (Addendum)
[facility] - [place] at [facility]   PATIENT NAME: Denise Duke    MR#:  [id number]  DATE OF BIRTH:  06-03-1927  SUBJECTIVE:   Seen in the ICU came in with respiratory distress. On levophed gtt. Hard on hearing REVIEW OF SYSTEMS:    Review of Systems  Unable to perform ROS: medical condition    DRUG ALLERGIES:  No Active Allergies  VITALS:  Blood pressure 118/57, pulse 98, temperature 100.1 F (37.8 C), temperature source Oral, resp. rate 17, height 4\' 9"  (1.448 m), weight 76.6 kg (168 lb 14 oz), SpO2 96 %.  PHYSICAL EXAMINATION:  GENERAL: 81 y.o.-year-old patient lying in the bed with no acute distress. Obese.critically ill EYES: Pupils equal, round, reactive to light and accommodation. No scleral icterus. Extraocular muscles intact. Decreased hearing. HEENT: Head atraumatic, normocephalic. Oropharynx and nasopharynx clear. No oropharyngeal erythema, moist oral mucosa  NECK: Supple, no jugular venous distention. No thyroid enlargement, no tenderness.  LUNGS: Bilateral crackles and weezing.. No use of accessory muscles of respiration.  CARDIOVASCULAR: S1, S2 normal. No murmurs, rubs, or gallops.  ABDOMEN: Soft, nontender, nondistended. Bowel sounds present. No organomegaly or mass.  EXTREMITIES: No pedal edema, cyanosis, or clubbing. + 2 pedal & radial pulses b/l.  NEUROLOGIC: Cranial nerves II through XII are intact. No focal Motor or sensory deficits appreciated b/l, very weak PSYCHIATRIC: The patient is alert and oriented x 3. Good affect.  SKIN: No obvious rash, lesion, or ulcer.  Physical Exam  LABORATORY PANEL:   CBC  Recent Labs Lab 08/15/14 1238  WBC 8.5  HGB 11.8*  HCT 36.9  PLT 166   ------------------------------------------------------------------------------------------------------------------  Chemistries   Recent Labs Lab 08/15/14 1238  NA 138  K 4.3  CL 94*  CO2 32  GLUCOSE 91  BUN 33*  CREATININE 1.83*   CALCIUM 7.4*  AST 38  ALT 27  ALKPHOS 96  BILITOT 0.9   ------------------------------------------------------------------------------------------------------------------  Cardiac Enzymes  Recent Labs Lab 08/15/14 1238  TROPONINI 0.15*   ------------------------------------------------------------------------------------------------------------------  RADIOLOGY:  Dg Chest 1 View  08/15/2014   CLINICAL DATA:  Central line placement.  Initial encounter.  EXAM: CHEST  1 VIEW  COMPARISON:  Chest radiograph performed earlier today at 12:38 p.m.  FINDINGS: The lungs are hypoexpanded. A right PICC is noted ending about the mid SVC. Chronic fibrotic changes are again seen bilaterally. Opacities are somewhat worsened, either reflecting worsening interstitial lung disease or mild superimposed atelectasis or pneumonia. The appearance is atypical for edema. There is no evidence of significant pleural effusion or pneumothorax.  The cardiomediastinal silhouette is mildly enlarged. No acute osseous abnormalities are seen.  IMPRESSION: 1. Right PICC noted ending about the mid SVC. 2. Lungs hypoexpanded. Chronic fibrotic changes again noted bilaterally. Opacities are somewhat worsened, either reflecting worsening interstitial lung disease or mild superimposed atelectasis or pneumonia. The appearance is atypical for edema. 3. Mild cardiomegaly.   Electronically Signed   By: 10/15/2014 M.D.   On: 08/15/2014 18:36   Dg Chest 1 View  08/15/2014   CLINICAL DATA:  Respiratory distress  EXAM: CHEST  1 VIEW  COMPARISON:  08/05/2014  FINDINGS: Cardiac shadow is enlarged. Stable interstitial changes consistent with pulmonary fibrosis are noted bilaterally. No definitive acute infiltrate or sizable effusion is seen. No bony abnormality is noted.  IMPRESSION: Chronic fibrotic changes without acute abnormality.   Electronically Signed   By: 08/07/2014 M.D.   On: 08/15/2014 13:27     ASSESSMENT AND PLAN:  [name]  is an 81 yo woman with PMH of O2-dependent COPD/pulmonary fibrosis, diastolic dysfunction with h/o CHF, RA, h/o chest pain, GERD, IBS, deconditioning, HTN, hyperlipidemia, anxiety, TIA, s/p hysterectomy, cholecystectomy, L. knee sgy, hernia repair, B. foot sgy. She was just hospitalized 5/23/-08/07/14 with acute on chronic respiratory failure. She is now readmitted 08/15/14 with same..  1. Septic shock: Had fever of 103.1, blood pressure 68/33 and heart rate as high as up to 108. Lactic acid is 2.1.  -CXR shows increased bilateral infiitrates with underlying fibrosis -cont broad-spectrum antibiotics for possible hospital acquired infection -IV vanc and zosyn -send sputum cx -BC pending - Oxygen requirement at baseline and is needing 3-4 L oxygen. -Weaning IV levophed  2. Elevated troponin. Patient has had chronic elevation of troponin around 0.1. Presently this is increased to 0.15. Likely from underlying sepsis. No chest pain.  Likely supply demand ischemia.  3. Diastolic chf -Hold lasix since creat up 1.8(baseline 0.9)  4. Acute renal failure/ATN in the setting of sepsis -received IVF -decrease rate to 50 cc/hr. Has h/o CHF and avoid volume overload -We will hydrate with IV fluid and monitor her renal function, likely prerenal in nature  5. Hypotension: Stop BP meds due to hypotension  6.Known h/o Pulmonary fibrosis  7.Chornic Anxiety resume Klonopin  8.Code DNR. Palliative care team to meet with family today -pt came from ALF with hospice following her  Case discussed with Care Management/Social Worker. Management plans discussed with the patient and  in agreement.  CODE STATUS: DNR  DVT Prophylaxis: heparin  TOTAL CRITICAL TIME TAKING CARE OF THIS PATIENT:79minutes.   POSSIBLE D/C IN 2-3 DAYS, DEPENDING ON CLINICAL CONDITION.   Rena Hunke M.D on 08/16/2014 at 8:20 AM  Between 7am to 6pm - Pager - [phone]  After 6pm go to [email or web address] - password [email or web address]  [facility]  [facility]  Office  [phone]  CC: Primary care physician; Rolm Gala, MD

## 2014-08-16 NOTE — Progress Notes (Signed)
incontinent

## 2014-08-16 NOTE — Progress Notes (Addendum)
CSW spoke with pt's HCPOAn Rosey Bath [phone], re: recommended higher level of Care--SNF with hospice following.  Pt's HCPOA reports she is agreeable to SNF and requested CSW updated pt's granddtr, Currie Paris [phone]. No answer on pt's granddtr's phone and no option for voicemail.  Pt's HCPOA reports pt with previous SNF stays and "loves [facility]."  FL2 complete and referral made to [facility]. PASRR updated to SNF level of care.  Will f/u with pt's HCPOA once [facility] determines if they are able to extend bed offer. Dellie Burns, MSW, LCSW [phone] (coverage)

## 2014-08-16 NOTE — Progress Notes (Signed)
RN paged MD (Dr. Allena Katz) about patient's irregular heart rate. Patient with sinus arrthymia, tachycardiac in 100s up to 130s - unsustained, p waves present, with frequent PACs. MD wants to watch for now.

## 2014-08-16 NOTE — Progress Notes (Signed)
   08/16/14 1600  Clinical Encounter Type  Visited With Patient  Visit Type Initial  Referral From Nurse  Consult/Referral To Chaplain  Spiritual Encounters  Spiritual Needs Prayer  Stress Factors  Patient Stress Factors Health changes  Chaplain went to visit with patient and offered support as applicable. Patient requested prayer and I prayed for her and her family. Chaplain Biviana Saddler A. Regino Fournet Ext. [phone]

## 2014-08-16 NOTE — Consult Note (Signed)
Hospice Home Discharge Orders   Patient:  Denise Duke is an 81 y.o., female MRN:  [id number] DOB:  05-30-1927 Patient phone:  [phone] (home)  Patient address:   [address] [place] [address],     Admit: Admit to Hospice Home  Code Status: DNR  Diet: as tolerated  Activity: as tolerated  Oxygen: use for comfort  Foley: Leave foley cath for urinary incontinence/previous skin breakdown   Date of Admission:  08/15/2014   Principal Problem: <principal problem not specified> Discharge Diagnoses: Patient Active Problem List   Diagnosis Date Noted  . HCAP (healthcare-associated pneumonia) [J18.9] 08/07/2014  . Acute-on-chronic respiratory failure [J96.20] 08/07/2014  . Acute respiratory distress [J80] 07/28/2014  . Atrial fibrillation [I48.91] 07/28/2014  . COPD exacerbation [J44.1] 07/28/2014  . UTI (lower urinary tract infection) [N39.0] 07/23/2014  . Sepsis [A41.9] 07/23/2014  . ARF (acute renal failure) [N17.9] 07/23/2014  . Septic shock [A41.9, R65.21] 07/23/2014      Medications: May crush or use liquid when appropriate. May change to rectal route if unable to swallow.    Medication List    STOP taking these medications        amoxicillin-clavulanate 500-125 MG per tablet  Commonly known as:  AUGMENTIN     Calcium Carbonate-Vitamin D 600-400 MG-UNIT per tablet     carvedilol 6.25 MG tablet  Commonly known as:  COREG     clonazePAM 0.5 MG tablet  Commonly known as:  KLONOPIN     clopidogrel 75 MG tablet  Commonly known as:  PLAVIX     doxycycline 100 MG tablet  Commonly known as:  VIBRA-TABS     Fluticasone-Salmeterol 500-50 MCG/DOSE Aepb  Commonly known as:  ADVAIR     isosorbide mononitrate 30 MG 24 hr tablet  Commonly known as:  IMDUR     leflunomide 20 MG tablet  Commonly known as:  ARAVA     nystatin cream  Commonly known as:  MYCOSTATIN     predniSONE 50 MG tablet  Commonly known as:  DELTASONE     traMADol 50  MG tablet  Commonly known as:  ULTRAM      TAKE these medications        furosemide 20 MG tablet  Commonly known as:  LASIX  Take 1 tablet (20 mg total) by mouth daily.     LORazepam 1 MG tablet  Commonly known as:  ATIVAN  Take 1 tablet (1 mg total) by mouth every hour as needed for anxiety.     morphine 20 MG/5ML solution  Take 1.3 mLs (5.2 mg total) by mouth every hour as needed for pain.           Comments:    SignedSteele Berg [name] 08/16/2014, 3:21 PM

## 2014-08-16 NOTE — Consult Note (Signed)
Follow up visit made. I spoke with Denise Duke about her wishes for d/c. Denise Duke expressed that she is "tired" and that she wants her care to be focused on her comfort. We talked about transfer to the Hospice Home. Denise Duke says that her husband was at the [facility] and she was very pleased with the care he received. Denise Duke became tearful and said that she wants to go to the Hospice Home.   I spoke with Denise Duke, Sheralyn Boatman, who is Denise Education officer, environmental. She agrees with comfort care at the [facility]. Hospice Home liason present and spoke with Denise Duke and Duke as well.   Hospice Home orders entered.

## 2014-08-16 NOTE — Progress Notes (Signed)
Visit made.Patient seen lying in bed, alert and oriented x 4, dyspneic with any exertion, even talking.  She remains on Levophed for blood pressure support. Dr. Harvie Junior in during visit, spoke with patient and then with her granddaughter Irem Stoneham [phone]) plan is now for patient to be made full comfort care with transfer to the Hospice Home tomorrow 6/4. She will need nonemergent EMS transport with portable DNR in place. CMRN Misty Stanley and CSW Josie made aware. Writer contacted attending physician Dr. Allena Katz who was is also in agreement. Hospice team updated. Referral intake  and Hospice home notified, information faxed to referral intake. Transfer time to be coordinated by Hospice home and CSW Josie. Writer spoke to patient's granddaughter Sheralyn Boatman to explain services, she is aware that she needs to go to the hospice home tomorrow to sign consents. Thank you. Dayna Barker RN, BSN, [facility] [place], [facility] [phone] c

## 2014-08-16 NOTE — Consult Note (Signed)
Palliative Medicine Inpatient Consult Follow Up Note   Name: Denise Duke Date: 08/16/2014 MRN: [id number]  DOB: 23-Aug-1927  Referring Physician: Enedina Finner, MD  Palliative Care consult requested for this 81 y.o. female for goals of medical therapy in patient with 02-dependent COPD/pulmonary fibrosis admitted with acute on chronic respiratory failure   Ms Denise Duke is lying in bed in CCU. Much more alert today. Ate breakfast. Family not present.     REVIEW OF SYSTEMS:  Pain: None Dyspnea:  Yes Nausea/Vomiting:  No Diarrhea:  No Constipation:   No Depression:   No Anxiety:   No Fatigue:   Yes  CODE STATUS: DNR   PAST MEDICAL HISTORY: Past Medical History  Diagnosis Date  . CHF (congestive heart failure)   . TIA (transient ischemic attack)   . Anxiety   . COPD (chronic obstructive pulmonary disease)   . Hypertension   . Pulmonary fibrosis   . Hearing loss   . Arthritis   . GERD (gastroesophageal reflux disease)     PAST SURGICAL HISTORY:  Past Surgical History  Procedure Laterality Date  . Cholecystectomy    . Hernia repair    . Abdominal hysterectomy      Vital Signs: BP 118/57 mmHg  Pulse 98  Temp(Src) 100.6 F (38.1 C) (Axillary)  Resp 17  Ht 4\' 9"  (1.448 m)  Wt 76.6 kg (168 lb 14 oz)  BMI 36.53 kg/m2  SpO2 96% Filed Weights   08/15/14 1214 08/15/14 1630  Weight: 78.019 kg (172 lb) 76.6 kg (168 lb 14 oz)    Estimated body mass index is 36.53 kg/(m^2) as calculated from the following:   Height as of this encounter: 4\' 9"  (1.448 m).   Weight as of this encounter: 76.6 kg (168 lb 14 oz).  PHYSICAL EXAM: General: critically ill-appearing HEENT: OP clear, hearing impaired Neck: Trachea midline  Cardiovascular: regular rhythm, tachycardic Pulmonary/Chest: poor air movement, no audible wheeze Abdominal: Soft, NTTP, + bowel sounds GU: No SP tenderness Extremities: + edema, TED hose/SCD's in place Neurological: answers questions appropriately, follows  simple commands, moves extremities Skin: no rashes Psychiatric:alert, oriented  LABS: CBC:    Component Value Date/Time   WBC 8.5 08/15/2014 1238   WBC 4.5 07/12/2014 0344   HGB 11.8* 08/15/2014 1238   HGB 9.8* 07/12/2014 0344   HCT 36.9 08/15/2014 1238   HCT 30.5* 07/12/2014 0344   PLT 166 08/15/2014 1238   PLT 133* 07/12/2014 0344   MCV 91.3 08/15/2014 1238   MCV 94 07/12/2014 0344   NEUTROABS 6.9* 08/15/2014 1238   NEUTROABS 3.7 07/12/2014 0344   LYMPHSABS 0.9* 08/15/2014 1238   LYMPHSABS 0.4* 07/12/2014 0344   MONOABS 0.5 08/15/2014 1238   MONOABS 0.4 07/12/2014 0344   EOSABS 0.1 08/15/2014 1238   EOSABS 0.0 07/12/2014 0344   BASOSABS 0.0 08/15/2014 1238   BASOSABS 0.0 07/12/2014 0344   Comprehensive Metabolic Panel:    Component Value Date/Time   NA 138 08/15/2014 1238   NA 140 07/12/2014 0344   K 4.3 08/15/2014 1238   K 4.3 07/12/2014 0344   CL 94* 08/15/2014 1238   CL 105 07/12/2014 0344   CO2 32 08/15/2014 1238   CO2 25 07/12/2014 0344   BUN 33* 08/15/2014 1238   BUN 16 07/12/2014 0344   CREATININE 1.83* 08/15/2014 1238   CREATININE 0.87 07/12/2014 0344   GLUCOSE 91 08/15/2014 1238   GLUCOSE 116* 07/12/2014 0344   CALCIUM 7.4* 08/15/2014 1238   CALCIUM 8.3* 07/12/2014 0344  AST 38 08/15/2014 1238   AST 23 07/11/2014 1034   ALT 27 08/15/2014 1238   ALT 13* 07/11/2014 1034   ALKPHOS 96 08/15/2014 1238   ALKPHOS 46 07/11/2014 1034   BILITOT 0.9 08/15/2014 1238   PROT 6.6 08/15/2014 1238   PROT 7.1 07/11/2014 1034   ALBUMIN 3.0* 08/15/2014 1238   ALBUMIN 3.3* 07/11/2014 1034    IMPRESSION: Ms Denise Duke is an 81 yo woman with PMH of O2-dependent COPD/pulmonary fibrosis, diastolic dysfunction with h/o CHF, RA, h/o chest pain, GERD, IBS, deconditioning, HTN, hyperlipidemia, anxiety, TIA, s/p hysterectomy, cholecystectomy, L. knee sgy, hernia repair, B. foot sgy. She was just hospitalized 5/23/-08/07/14 with acute on chronic respiratory failure. She is now  readmitted 08/15/14 with same. At present, pt is lying on stretcher in the ER, minimally responsive. Sister at bedside. This is pt's 6th hospitalization in the past 4 months.  Pt much more alert this AM. I spoke with her about d/c to a higher level of care and she agrees with this. Plan is to complete MOST form for pt prior to d/c but she is not yet ready for that discussion. Would also like to include family in this discussion. I called granddaughter Sheralyn Boatman to try to arrange family meeting but no answer.   PLAN: As above  REFERRALS TO BE ORDERED:  Social work   More than 50% of the visit was spent in counseling/coordination of care: YES  Time spent: 35 minutes

## 2014-08-16 NOTE — Care Management Note (Signed)
Case Management Note  Patient Details  Name: Denise Duke MRN: [id number] Date of Birth: March 01, 1928  Subjective/Objective:   Case discussed with Dr. Harvie Junior. It is recommended that patient be discharged to SNF followed by Hospice when medically stable. CSW updated.                   Action/Plan:   Expected Discharge Date:                  Expected Discharge Plan:     In-House Referral:  Clinical Social Work  Discharge planning Services  CM Consult  Post Acute Care Choice:    Choice offered to:     DME Arranged:    DME Agency:     HH Arranged:    HH Agency:  [facility]  Status of Service:  In process, will continue to follow  Medicare Important Message Given:  Yes Date Medicare IM Given:  08/16/14 Medicare IM give by:  Gweneth Dimitri Date Additional Medicare IM Given:    Additional Medicare Important Message give by:     If discussed at Long Length of Stay Meetings, dates discussed:    Additional Comments:  [name], RN 08/16/2014, 9:43 AM

## 2014-08-16 NOTE — Care Management Note (Signed)
Case Management Note  Patient Details  Name: Denise Duke MRN: [id number] Date of Birth: Aug 26, 1927  Subjective/Objective:    Presents from [facility] with sepsis. Recently at [facility]05[facility]25 for the same. Active with [facility]. [name], hospice liaison aware of admission. Palliative Care to speak with family regarding goals of care.  CSW made aware of admission. Following               Action/Plan:   Expected Discharge Date:                  Expected Discharge Plan:     In-House Referral:  Clinical Social Work  Discharge planning Services  CM Consult  Post Acute Care Choice:    Choice offered to:     DME Arranged:    DME Agency:     HH Arranged:    HH Agency:  [facility]  Status of Service:  In process, will continue to follow  Medicare Important Message Given:  Yes Date Medicare IM Given:  08/16/14 Medicare IM give by:  Gweneth Dimitri Date Additional Medicare IM Given:    Additional Medicare Important Message give by:     If discussed at Long Length of Stay Meetings, dates discussed:    Additional Comments:  [name], RN 08/16/2014, 8:42 AM

## 2014-08-16 NOTE — Progress Notes (Signed)
Pt has been offered a bed at [facility]. Per Jomarie Longs at [facility], pt will need to come under Medicare benefit and transition to [facility] with hospice following. Pt has a qualifying stay for Medicare SNF benefit 07/23/14 to 07/30/14 if pt is d/c before Sunday. Pt's HCPOA aware. Will notify [name] with [facility]. Dellie Burns, MSW, LCSW [phone] (coverage 2A/CCU)

## 2014-08-16 NOTE — Clinical Social Work Placement (Signed)
   CLINICAL SOCIAL WORK PLACEMENT  NOTE  Date:  08/16/2014  Patient Details  Name: Denise Duke MRN: [id number] Date of Birth: 12-Sep-1927  Clinical Social Work is seeking post-discharge placement for this patient at the Skilled  Nursing Facility level of care (*CSW will initial, date and re-position this form in  chart as items are completed):  Yes   Patient/family provided with [facility] Clinical Social Work Department's list of facilities offering this level of care within the geographic area requested by the patient (or if unable, by the patient's family).  Yes   Patient/family informed of their freedom to choose among providers that offer the needed level of care, that participate in Medicare, Medicaid or managed care program needed by the patient, have an available bed and are willing to accept the patient.  Yes   Patient/family informed of [facility]'s ownership interest in [facility] and [facility], as well as of the fact that they are under no obligation to receive care at these facilities.  PASRR submitted to EDS on 08/16/14     PASRR number received on 08/16/14     Existing PASRR number confirmed on       FL2 transmitted to all facilities in geographic area requested by pt/family on 08/16/14     FL2 transmitted to all facilities within larger geographic area on       Patient informed that his/her managed care company has contracts with or will negotiate with certain facilities, including the following:            Patient/family informed of bed offers received.  Patient chooses bed at       Physician recommends and patient chooses bed at      Patient to be transferred to   on  .  Patient to be transferred to facility by       Patient family notified on   of transfer.  Name of family member notified:        PHYSICIAN       Additional Comment:    _______________________________________________ Deatra Robinson, LCSW 08/16/2014, 10:17 AM

## 2014-08-16 NOTE — Progress Notes (Signed)
RN called MD about temperature of 100.6 degrees F and order for rectal tylenol for "fever" with no temperature listed. MD changed order to oral tyelnol for fever greater than 100.5 degrees F.

## 2014-08-17 LAB — URINE CULTURE: Culture: NO GROWTH

## 2014-08-17 MED ORDER — PROCHLORPERAZINE MALEATE 10 MG PO TABS: 10.0000 mg | ORAL_TABLET | Freq: Four times a day (QID) | ORAL | Status: AC | PRN

## 2014-08-17 MED ORDER — LORAZEPAM 1 MG PO TABS: 1.0000 mg | ORAL_TABLET | ORAL | Status: AC | PRN

## 2014-08-17 MED ORDER — MORPHINE SULFATE (CONCENTRATE) 10 MG/0.5ML PO SOLN: 5.0000 mg | ORAL | Status: AC | PRN

## 2014-08-17 MED ORDER — LORAZEPAM 2 MG/ML PO CONC: 1.0000 mg | ORAL | Status: AC | PRN

## 2014-08-17 MED ORDER — PROCHLORPERAZINE 25 MG RE SUPP: 25.0000 mg | Freq: Two times a day (BID) | RECTAL | Status: AC | PRN

## 2014-08-17 NOTE — Progress Notes (Signed)
Patient did become restless and yelling out at 0445, labored breathing noted, 02 via [place] intact, nurse administered roxanol 5mg  and ativan 1mg , called her grand-daughter to let her know the change in status, she said she would be right over. Patient reassessed and is sleeping

## 2014-08-17 NOTE — Discharge Summary (Signed)
[facility] NAME: Denise Duke    MR#:  [id number]  DATE OF BIRTH:  [birth year]  DATE OF ADMISSION:  08/15/2014 ADMITTING PHYSICIAN: Max Sane, MD  DATE OF DISCHARGE:  PRIMARY CARE PHYSICIAN: Hortencia Pilar, MD    ADMISSION DIAGNOSIS:  Hypoxia [R09.02] Sepsis, due to unspecified organism [A41.9]  DISCHARGE DIAGNOSIS:  *Septic Shock *Acute renal Failure?ATN *Diastolic CHF *Chronic pulmonary fibrosis  SECONDARY DIAGNOSIS:   Past Medical History  Diagnosis Date  . CHF (congestive heart failure)   . TIA (transient ischemic attack)   . Anxiety   . COPD (chronic obstructive pulmonary disease)   . Hypertension   . Pulmonary fibrosis   . Hearing loss   . Arthritis   . GERD (gastroesophageal reflux disease)     HOSPITAL COURSE:   Denise Duke is an 81 yo woman with PMH of O2-dependent COPD/pulmonary fibrosis, diastolic dysfunction with h/o CHF, RA, h/o chest pain, GERD, IBS, deconditioning, HTN, hyperlipidemia, anxiety, TIA, s/p hysterectomy, cholecystectomy, L. knee sgy, hernia repair, B. foot sgy. She was just hospitalized 5/23/-08/07/14 with acute on chronic respiratory failure. She is now readmitted 08/15/14 with same..  1. Septic shock: Had fever of 103.1, blood pressure 68/33 and heart rate as high as up to 108. Lactic acid is 2.1.  -CXR shows increased bilateral infiitrates with underlying fibrosis -cont broad-spectrum antibiotics for possible hospital acquired infection - Oxygen requirement at baseline and is needing 3-4 L oxygen. -off IV levophed 2. Elevated troponin. Patient has had chronic elevation of troponin around 0.1. Presently this is increased to 0.15. Likely from underlying sepsis. No chest pain.  Likely supply demand ischemia. 3. Diastolic chf 4. Acute renal failure/ATN in the setting of sepsis -received IVF 5. Hypotension: Stop BP meds due to hypotension 6.Known h/o Pulmonary fibrosis 7.Chornic  Anxiety resume Klonopin 8.Code DNR. Palliative care team to met with family and plan is for pt to go to the hospice home  DISCHARGE CONDITIONS:  guarded  CONSULTS OBTAINED:   Palliative care DRUG ALLERGIES:  No Active Allergies  DISCHARGE MEDICATIONS:   Current Discharge Medication List    START taking these medications   Details  !! LORazepam (ATIVAN) 1 MG tablet Take 1 tablet (1 mg total) by mouth every hour as needed for anxiety. Qty: 30 tablet, Refills: 0    !! LORazepam (ATIVAN) 1 MG tablet Take 1 tablet (1 mg total) by mouth every hour as needed for anxiety. Qty: 30 tablet, Refills: 0    LORazepam (ATIVAN) 2 MG/ML concentrated solution Place 0.5 mLs (1 mg total) under the tongue every hour as needed for anxiety. Qty: 30 mL, Refills: 0    !! Morphine Sulfate (MORPHINE CONCENTRATE) 10 MG/0.5ML SOLN concentrated solution Take 0.25 mLs (5 mg total) by mouth every 2 (two) hours as needed for moderate pain (or dyspnea). Qty: 42 mL, Refills: 0    !! Morphine Sulfate (MORPHINE CONCENTRATE) 10 MG/0.5ML SOLN concentrated solution Place 0.25 mLs (5 mg total) under the tongue every 2 (two) hours as needed for moderate pain (or dyspnea). Qty: 42 mL, Refills: 0    prochlorperazine (COMPAZINE) 10 MG tablet Take 1 tablet (10 mg total) by mouth every 6 (six) hours as needed for nausea or vomiting. Qty: 30 tablet, Refills: 0    prochlorperazine (COMPAZINE) 25 MG suppository Place 1 suppository (25 mg total) rectally every 12 (twelve) hours as needed for nausea or vomiting. Qty: 12 suppository, Refills: 0     !! -  Potential duplicate medications found. Please discuss with provider.    CONTINUE these medications which have NOT CHANGED   Details  furosemide (LASIX) 20 MG tablet Take 1 tablet (20 mg total) by mouth daily. Qty: 30 tablet, Refills: 6      STOP taking these medications     Calcium Carbonate-Vitamin D 600-400 MG-UNIT per tablet      carvedilol (COREG) 6.25 MG tablet       clopidogrel (PLAVIX) 75 MG tablet      Fluticasone-Salmeterol (ADVAIR) 500-50 MCG/DOSE AEPB      isosorbide mononitrate (IMDUR) 30 MG 24 hr tablet      leflunomide (ARAVA) 20 MG tablet      amoxicillin-clavulanate (AUGMENTIN) 500-125 MG per tablet      clonazePAM (KLONOPIN) 0.5 MG tablet      clonazePAM (KLONOPIN) 0.5 MG tablet      doxycycline (VIBRA-TABS) 100 MG tablet      nystatin cream (MYCOSTATIN)      predniSONE (DELTASONE) 50 MG tablet      traMADol (ULTRAM) 50 MG tablet        If you experience worsening of your admission symptoms, develop shortness of breath, life threatening emergency, suicidal or homicidal thoughts you must seek medical attention immediately by calling 911 or calling your MD immediately  if symptoms less severe.  You Must read complete instructions/literature along with all the possible adverse reactions/side effects for all the Medicines you take and that have been prescribed to you. Take any new Medicines after you have completely understood and accept all the possible adverse reactions/side effects.   Please note  You were cared for by a hospitalist during your hospital stay. If you have any questions about your discharge medications or the care you received while you were in the hospital after you are discharged, you can call the unit and asked to speak with the hospitalist on call if the hospitalist that took care of you is not available. Once you are discharged, your primary care physician will handle any further medical issues. Please note that NO REFILLS for any discharge medications will be authorized once you are discharged, as it is imperative that you return to your primary care physician (or establish a relationship with a primary care physician if you do not have one) for your aftercare needs so that they can reassess your need for medications and monitor your lab values. Today   SUBJECTIVE  Family in the room. Pt resting  quietly  VITAL SIGNS:  Blood pressure 91/55, pulse 98, temperature 99.4 F (37.4 C), temperature source Oral, resp. rate 19, height $RemoveBe'4\' 9"'YCAViGHkr$  (1.448 m), weight 76.6 kg (168 lb 14 oz), SpO2 93 %.  I/O:   Intake/Output Summary (Last 24 hours) at 08/17/14 1034 Last data filed at 08/17/14 0800  Gross per 24 hour  Intake 332.75 ml  Output      0 ml  Net 332.75 ml    PHYSICAL EXAMINATION:  GENERAL:  80 y.o.-year-old patient lying in the bed with no acute distress. Critically ill EYES: Pupils equal, round, reactive to light and accommodation. No scleral icterus. Extraocular muscles intact.  HEENT: Head atraumatic, normocephalic. Oropharynx and nasopharynx clear.  NECK:  Supple, no jugular venous distention. No thyroid enlargement, no tenderness.  LUNGS: shallow breath sounds bilaterally, no wheezing, rales,rhonchi or crepitation.CARDIOVASCULAR: S1, S2 normal. No murmurs, rubs, or gallops.  ABDOMEN: Soft, non-tender, non-distended. Bowel sounds present. No organomegaly or mass.  EXTREMITIES: 2+ pedal edema,no cyanosis,no clubbing.  NEUROLOGIC:unable to  assess PSYCHIATRIC: unable to assess SKIN: No obvious rash, lesion, or ulcer.  DATA REVIEW:   CBC   Recent Labs Lab 08/15/14 1238  WBC 8.5  HGB 11.8*  HCT 36.9  PLT 166    Chemistries   Recent Labs Lab 08/15/14 1238 08/16/14 1035  NA 138 136  K 4.3 3.6  CL 94* 97*  CO2 32 30  GLUCOSE 91 148*  BUN 33* 23*  CREATININE 1.83* 1.54*  CALCIUM 7.4* 6.5*  MG  --  1.1*  AST 38  --   ALT 27  --   ALKPHOS 96  --   BILITOT 0.9  --     Microbiology Results   Recent Results (from the past 240 hour(s))  Blood culture (routine x 2)     Status: None (Preliminary result)   Collection Time: 08/15/14 12:35 PM  Result Value Ref Range Status   Specimen Description BLOOD  Final   Special Requests NONE  Final   Culture NO GROWTH 2 DAYS  Final   Report Status PENDING  Incomplete  Blood culture (routine x 2)     Status: None  (Preliminary result)   Collection Time: 08/15/14 12:42 PM  Result Value Ref Range Status   Specimen Description BLOOD  Final   Special Requests NONE  Final   Culture NO GROWTH 2 DAYS  Final   Report Status PENDING  Incomplete  Urine culture     Status: None   Collection Time: 08/15/14  1:13 PM  Result Value Ref Range Status   Specimen Description URINE, RANDOM  Final   Special Requests NONE  Final   Culture NO GROWTH 2 DAYS  Final   Report Status 08/17/2014 FINAL  Final  MRSA PCR Screening     Status: None   Collection Time: 08/15/14 11:00 PM  Result Value Ref Range Status   MRSA by PCR NEGATIVE NEGATIVE Final    Comment:        The GeneXpert MRSA Assay (FDA approved for NASAL specimens only), is one component of a comprehensive MRSA colonization surveillance program. It is not intended to diagnose MRSA infection nor to guide or monitor treatment for MRSA infections.   Culture, sputum-assessment     Status: None   Collection Time: 08/16/14  8:27 AM  Result Value Ref Range Status   Specimen Description SPUTUM  Final   Special Requests Normal  Final   Sputum evaluation   Final    Sputum specimen not acceptable for testing.  Please recollect.   Results Called to: [place] 08/16/14 DV    Report Status 08/16/2014 FINAL  Final    RADIOLOGY:  Dg Chest 1 View  08/15/2014   CLINICAL DATA:  Central line placement.  Initial encounter.  EXAM: CHEST  1 VIEW  COMPARISON:  Chest radiograph performed earlier today at 12:38 p.m.  FINDINGS: The lungs are hypoexpanded. A right PICC is noted ending about the mid SVC. Chronic fibrotic changes are again seen bilaterally. Opacities are somewhat worsened, either reflecting worsening interstitial lung disease or mild superimposed atelectasis or pneumonia. The appearance is atypical for edema. There is no evidence of significant pleural effusion or pneumothorax.  The cardiomediastinal silhouette is mildly enlarged. No acute osseous  abnormalities are seen.  IMPRESSION: 1. Right PICC noted ending about the mid SVC. 2. Lungs hypoexpanded. Chronic fibrotic changes again noted bilaterally. Opacities are somewhat worsened, either reflecting worsening interstitial lung disease or mild superimposed atelectasis or pneumonia. The appearance is atypical  for edema. 3. Mild cardiomegaly.   Electronically Signed   By: Garald Balding M.D.   On: 08/15/2014 18:36   Dg Chest 1 View  08/15/2014   CLINICAL DATA:  Respiratory distress  EXAM: CHEST  1 VIEW  COMPARISON:  08/05/2014  FINDINGS: Cardiac shadow is enlarged. Stable interstitial changes consistent with pulmonary fibrosis are noted bilaterally. No definitive acute infiltrate or sizable effusion is seen. No bony abnormality is noted.  IMPRESSION: Chronic fibrotic changes without acute abnormality.   Electronically Signed   By: Inez Catalina M.D.   On: 08/15/2014 13:27     Management plans discussed with the patient, family and they are in agreement.  CODE STATUS:  Advance Directive Documentation        Most Recent Value   Type of Advance Directive  Out of facility DNR (pink MOST or yellow form)   Pre-existing out of facility DNR order (yellow form or pink MOST form)  Physician notified to receive inpatient order, Yellow form placed in chart (order not valid for inpatient use)   "MOST" Form in Place?        TOTAL TIME TAKING CARE OF THIS PATIENT: 40 minutes.    Thia Olesen M.D on 08/17/2014 at 10:34 AM  Between 7am to 6pm - Pager - [phone] After 6pm go to [email or web address] - password [email or web address] [facility]  Office  [phone]  CC: Primary care physician; Hortencia Pilar, MD

## 2014-08-17 NOTE — Progress Notes (Signed)
Pt discharged via stretcher by EMS to Hospice home per orders.

## 2014-08-17 NOTE — Plan of Care (Signed)
Problem: Discharge Progression Outcomes Goal: Discharge plan in place and appropriate Outcome: Not Progressing Patient likes to be called [place] Patient is hard of hearing Patient is on comfort care, Copd,pulmonay fibrosis, pna, septic Goal: Pain controlled with appropriate interventions Outcome: Progressing Patient is comfort care, prn medications ordered for comfort.  Goal: Activity appropriate for discharge plan Outcome: Adequate for Discharge Patient is going to be discharged to hospice home.  Goal: Other Discharge Outcomes/Goals Outcome: Not Progressing Plan of care: progress to goal. Patient transferred from CCU, decision made per family for comfort care, admitted with copd, pulmary fibrosis, pna, history of CHF, TIA, HTN, she is on 4 liters of 02 via [place], will answer some question appropriately, she is hard of hearing.she complains of bilat feet aching, also dyspnea noted, shallow breathing. Morphine administered per nurse.Patient is going to be transferred to hospice today per orders.

## 2014-08-19 LAB — MISC LABCORP TEST (SEND OUT): LABCORP TEST CODE: 182246

## 2014-08-20 LAB — CULTURE, BLOOD (ROUTINE X 2)
Culture: NO GROWTH
Culture: NO GROWTH

## 2014-08-30 LAB — GRAM STAIN: Special Requests: NORMAL

## 2014-08-30 LAB — CULTURE, BLOOD (ROUTINE X 2)
SPECIAL REQUESTS: NORMAL
SPECIAL REQUESTS: NORMAL

## 2014-09-13 DEATH — deceased

## 2014-10-31 LAB — STREPTOCOCCUS PNEUMONIAE AG (SERUM)
